# Patient Record
Sex: Female | Born: 1965
Health system: Southern US, Community
[De-identification: ages and names within clinical notes are randomized; demographics above are authoritative.]

## PROBLEM LIST (undated history)

## (undated) DIAGNOSIS — T7840XA Allergy, unspecified, initial encounter: Secondary | ICD-10-CM

## (undated) DIAGNOSIS — J309 Allergic rhinitis, unspecified: Secondary | ICD-10-CM

## (undated) DIAGNOSIS — R42 Dizziness and giddiness: Secondary | ICD-10-CM

## (undated) HISTORY — DX: Allergy, unspecified, initial encounter: T78.40XA

## (undated) HISTORY — DX: Dizziness and giddiness: R42

## (undated) HISTORY — DX: Allergic rhinitis, unspecified: J30.9

---
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
## 1997-03-23 ENCOUNTER — Inpatient Hospital Stay (HOSPITAL_COMMUNITY): Admission: AD | Admit: 1997-03-23 | Discharge: 1997-03-23 | Payer: Self-pay | Admitting: Obstetrics and Gynecology

## 1997-05-22 ENCOUNTER — Inpatient Hospital Stay (HOSPITAL_COMMUNITY): Admission: AD | Admit: 1997-05-22 | Discharge: 1997-05-24 | Payer: Self-pay | Admitting: *Deleted

## 1997-05-27 ENCOUNTER — Encounter (HOSPITAL_COMMUNITY): Admission: RE | Admit: 1997-05-27 | Discharge: 1997-08-25 | Payer: Self-pay | Admitting: Obstetrics and Gynecology

## 1998-04-18 ENCOUNTER — Inpatient Hospital Stay (HOSPITAL_COMMUNITY): Admission: AD | Admit: 1998-04-18 | Discharge: 1998-04-18 | Payer: Self-pay | Admitting: *Deleted

## 1998-09-08 ENCOUNTER — Ambulatory Visit (HOSPITAL_COMMUNITY): Admission: RE | Admit: 1998-09-08 | Discharge: 1998-09-08 | Payer: Self-pay | Admitting: *Deleted

## 1999-03-27 ENCOUNTER — Inpatient Hospital Stay (HOSPITAL_COMMUNITY): Admission: AD | Admit: 1999-03-27 | Discharge: 1999-03-27 | Payer: Self-pay | Admitting: Obstetrics and Gynecology

## 1999-06-12 ENCOUNTER — Inpatient Hospital Stay (HOSPITAL_COMMUNITY): Admission: AD | Admit: 1999-06-12 | Discharge: 1999-06-14 | Payer: Self-pay | Admitting: Obstetrics and Gynecology

## 2000-12-20 ENCOUNTER — Encounter: Payer: Self-pay | Admitting: Obstetrics and Gynecology

## 2000-12-20 ENCOUNTER — Ambulatory Visit (HOSPITAL_COMMUNITY): Admission: RE | Admit: 2000-12-20 | Discharge: 2000-12-20 | Payer: Self-pay | Admitting: Obstetrics and Gynecology

## 2001-08-14 ENCOUNTER — Other Ambulatory Visit: Admission: RE | Admit: 2001-08-14 | Discharge: 2001-08-14 | Payer: Self-pay | Admitting: *Deleted

## 2002-01-23 ENCOUNTER — Ambulatory Visit (HOSPITAL_COMMUNITY): Admission: RE | Admit: 2002-01-23 | Discharge: 2002-01-23 | Payer: Self-pay | Admitting: Family Medicine

## 2002-01-23 ENCOUNTER — Encounter: Payer: Self-pay | Admitting: Family Medicine

## 2002-09-20 ENCOUNTER — Other Ambulatory Visit: Admission: RE | Admit: 2002-09-20 | Discharge: 2002-09-20 | Payer: Self-pay | Admitting: *Deleted

## 2004-06-04 ENCOUNTER — Emergency Department (HOSPITAL_COMMUNITY): Admission: EM | Admit: 2004-06-04 | Discharge: 2004-06-04 | Payer: Self-pay | Admitting: Family Medicine

## 2005-02-07 ENCOUNTER — Ambulatory Visit (HOSPITAL_COMMUNITY): Admission: RE | Admit: 2005-02-07 | Discharge: 2005-02-07 | Payer: Self-pay | Admitting: *Deleted

## 2005-11-12 ENCOUNTER — Emergency Department (HOSPITAL_COMMUNITY): Admission: EM | Admit: 2005-11-12 | Discharge: 2005-11-12 | Payer: Self-pay | Admitting: Family Medicine

## 2006-02-28 ENCOUNTER — Ambulatory Visit (HOSPITAL_COMMUNITY): Admission: RE | Admit: 2006-02-28 | Discharge: 2006-02-28 | Payer: Self-pay | Admitting: *Deleted

## 2006-08-07 ENCOUNTER — Inpatient Hospital Stay (HOSPITAL_COMMUNITY): Admission: AD | Admit: 2006-08-07 | Discharge: 2006-08-10 | Payer: Self-pay | Admitting: Obstetrics & Gynecology

## 2006-08-15 ENCOUNTER — Ambulatory Visit: Admission: RE | Admit: 2006-08-15 | Discharge: 2006-08-15 | Payer: Self-pay | Admitting: *Deleted

## 2006-08-18 ENCOUNTER — Ambulatory Visit: Admission: RE | Admit: 2006-08-18 | Discharge: 2006-08-18 | Payer: Self-pay | Admitting: *Deleted

## 2007-08-04 ENCOUNTER — Emergency Department (HOSPITAL_COMMUNITY): Admission: EM | Admit: 2007-08-04 | Discharge: 2007-08-04 | Payer: Self-pay | Admitting: Family Medicine

## 2008-02-22 HISTORY — PX: REFRACTIVE SURGERY: SHX103

## 2009-06-17 ENCOUNTER — Ambulatory Visit (HOSPITAL_COMMUNITY): Admission: RE | Admit: 2009-06-17 | Discharge: 2009-06-17 | Payer: Self-pay | Admitting: Obstetrics and Gynecology

## 2010-03-14 ENCOUNTER — Encounter: Payer: Self-pay | Admitting: Obstetrics and Gynecology

## 2010-07-09 NOTE — Discharge Summary (Signed)
NAMETERREL, MANALO NO.:  [id number]   MEDICAL RECORD NO.:  [id number]          PATIENT TYPE:  INP   LOCATION:  9112                          FACILITY:  [facility]   PHYSICIAN:  Genia Del, M.D.DATE OF BIRTH:  1965/09/01   DATE OF ADMISSION:  08/07/2006  DATE OF DISCHARGE:  08/10/2006                               DISCHARGE SUMMARY   ADMISSION DIAGNOSIS:  40+ weeks' gestation, spontaneous labor,  spontaneous rupture of membranes.   DISCHARGE DIAGNOSIS:  40+ weeks' gestation, spontaneous labor,  spontaneous rupture of membranes.  Birth of a baby girl by spontaneous  vaginal delivery.   HOSPITAL COURSE:  Unremarkable.  The patient was discharged in stable  status on postpartum day #2. She was given postpartum advice and will be  followed in 6 weeks at [facility]. Her postpartum hemoglobin was  11.4 and hematocrit 33.8.      Genia Del, M.D.  Electronically Signed     ML/MEDQ  D:  09/17/2006  T:  09/18/2006  Job:  [id number]

## 2010-07-22 ENCOUNTER — Other Ambulatory Visit (HOSPITAL_COMMUNITY): Payer: Self-pay | Admitting: Obstetrics & Gynecology

## 2010-07-22 DIAGNOSIS — Z1231 Encounter for screening mammogram for malignant neoplasm of breast: Secondary | ICD-10-CM

## 2010-08-10 ENCOUNTER — Ambulatory Visit (HOSPITAL_COMMUNITY): Payer: Self-pay

## 2010-11-15 ENCOUNTER — Ambulatory Visit (HOSPITAL_COMMUNITY)
Admission: RE | Admit: 2010-11-15 | Discharge: 2010-11-15 | Disposition: A | Discharge status: Home / Self Care | Payer: 59 | Source: Ambulatory Visit | Attending: Obstetrics & Gynecology | Admitting: Obstetrics & Gynecology

## 2010-11-15 DIAGNOSIS — Z1231 Encounter for screening mammogram for malignant neoplasm of breast: Secondary | ICD-10-CM | POA: Insufficient documentation

## 2010-12-08 LAB — CBC
Hemoglobin: 11.4 — ABNORMAL LOW
Hemoglobin: 12
MCV: 92.4
Platelets: 189
RBC: 3.66 — ABNORMAL LOW
RBC: 3.84 — ABNORMAL LOW
WBC: 10.2
WBC: 8.6

## 2010-12-08 LAB — RPR: RPR Ser Ql: NONREACTIVE

## 2012-01-11 ENCOUNTER — Other Ambulatory Visit (HOSPITAL_BASED_OUTPATIENT_CLINIC_OR_DEPARTMENT_OTHER): Payer: Self-pay | Admitting: Obstetrics & Gynecology

## 2012-01-11 DIAGNOSIS — Z1231 Encounter for screening mammogram for malignant neoplasm of breast: Secondary | ICD-10-CM

## 2012-01-23 ENCOUNTER — Inpatient Hospital Stay (HOSPITAL_BASED_OUTPATIENT_CLINIC_OR_DEPARTMENT_OTHER): Admission: RE | Admit: 2012-01-23 | Payer: 59 | Source: Ambulatory Visit

## 2012-01-27 ENCOUNTER — Ambulatory Visit (HOSPITAL_BASED_OUTPATIENT_CLINIC_OR_DEPARTMENT_OTHER)
Admission: RE | Admit: 2012-01-27 | Discharge: 2012-01-27 | Disposition: A | Discharge status: Home / Self Care | Payer: 59 | Source: Ambulatory Visit | Attending: Obstetrics & Gynecology | Admitting: Obstetrics & Gynecology

## 2012-01-27 DIAGNOSIS — Z1231 Encounter for screening mammogram for malignant neoplasm of breast: Secondary | ICD-10-CM

## 2013-03-25 ENCOUNTER — Other Ambulatory Visit (HOSPITAL_BASED_OUTPATIENT_CLINIC_OR_DEPARTMENT_OTHER): Payer: Self-pay | Admitting: Obstetrics & Gynecology

## 2013-03-25 DIAGNOSIS — Z1231 Encounter for screening mammogram for malignant neoplasm of breast: Secondary | ICD-10-CM

## 2013-04-03 ENCOUNTER — Ambulatory Visit (HOSPITAL_BASED_OUTPATIENT_CLINIC_OR_DEPARTMENT_OTHER): Payer: 59

## 2013-04-24 ENCOUNTER — Ambulatory Visit (HOSPITAL_BASED_OUTPATIENT_CLINIC_OR_DEPARTMENT_OTHER)
Admission: RE | Admit: 2013-04-24 | Discharge: 2013-04-24 | Disposition: A | Discharge status: Home / Self Care | Payer: 59 | Source: Ambulatory Visit | Attending: Obstetrics & Gynecology | Admitting: Obstetrics & Gynecology

## 2013-04-24 DIAGNOSIS — Z1231 Encounter for screening mammogram for malignant neoplasm of breast: Secondary | ICD-10-CM | POA: Insufficient documentation

## 2013-04-26 ENCOUNTER — Other Ambulatory Visit: Payer: Self-pay | Admitting: Obstetrics & Gynecology

## 2013-04-26 DIAGNOSIS — R928 Other abnormal and inconclusive findings on diagnostic imaging of breast: Secondary | ICD-10-CM

## 2013-05-10 ENCOUNTER — Ambulatory Visit
Admission: RE | Admit: 2013-05-10 | Discharge: 2013-05-10 | Disposition: A | Discharge status: Home / Self Care | Payer: Self-pay | Source: Ambulatory Visit | Attending: Obstetrics & Gynecology | Admitting: Obstetrics & Gynecology

## 2013-05-10 ENCOUNTER — Ambulatory Visit
Admission: RE | Admit: 2013-05-10 | Discharge: 2013-05-10 | Disposition: A | Discharge status: Home / Self Care | Payer: 59 | Source: Ambulatory Visit | Attending: Obstetrics & Gynecology | Admitting: Obstetrics & Gynecology

## 2013-05-10 DIAGNOSIS — R928 Other abnormal and inconclusive findings on diagnostic imaging of breast: Secondary | ICD-10-CM

## 2015-02-05 ENCOUNTER — Other Ambulatory Visit (HOSPITAL_BASED_OUTPATIENT_CLINIC_OR_DEPARTMENT_OTHER): Payer: Self-pay | Admitting: Obstetrics & Gynecology

## 2015-02-05 DIAGNOSIS — Z1231 Encounter for screening mammogram for malignant neoplasm of breast: Secondary | ICD-10-CM

## 2015-02-19 ENCOUNTER — Ambulatory Visit (HOSPITAL_BASED_OUTPATIENT_CLINIC_OR_DEPARTMENT_OTHER)
Admission: RE | Admit: 2015-02-19 | Discharge: 2015-02-19 | Disposition: A | Discharge status: Home / Self Care | Payer: 59 | Source: Ambulatory Visit | Attending: Obstetrics & Gynecology | Admitting: Obstetrics & Gynecology

## 2015-02-19 DIAGNOSIS — Z1231 Encounter for screening mammogram for malignant neoplasm of breast: Secondary | ICD-10-CM | POA: Insufficient documentation

## 2015-04-02 MED FILL — ESTROPIPATE 1.25(1.5 MG) TA: 1.5 | 30 days supply | Qty: 60 | Fill #0

## 2015-04-02 MED FILL — PROGESTERONE 100 MG CAPSULE: 100 | 30 days supply | Qty: 30 | Fill #0

## 2015-04-14 DIAGNOSIS — I8312 Varicose veins of left lower extremity with inflammation: Secondary | ICD-10-CM | POA: Diagnosis not present

## 2015-04-14 DIAGNOSIS — I83813 Varicose veins of bilateral lower extremities with pain: Secondary | ICD-10-CM | POA: Diagnosis not present

## 2015-04-14 DIAGNOSIS — I8311 Varicose veins of right lower extremity with inflammation: Secondary | ICD-10-CM | POA: Diagnosis not present

## 2015-04-15 DIAGNOSIS — I8311 Varicose veins of right lower extremity with inflammation: Secondary | ICD-10-CM | POA: Diagnosis not present

## 2015-04-15 DIAGNOSIS — I8312 Varicose veins of left lower extremity with inflammation: Secondary | ICD-10-CM | POA: Diagnosis not present

## 2015-04-15 DIAGNOSIS — I83813 Varicose veins of bilateral lower extremities with pain: Secondary | ICD-10-CM | POA: Diagnosis not present

## 2015-04-17 DIAGNOSIS — I83813 Varicose veins of bilateral lower extremities with pain: Secondary | ICD-10-CM | POA: Diagnosis not present

## 2015-05-06 DIAGNOSIS — I83811 Varicose veins of right lower extremities with pain: Secondary | ICD-10-CM | POA: Diagnosis not present

## 2015-05-06 DIAGNOSIS — I8311 Varicose veins of right lower extremity with inflammation: Secondary | ICD-10-CM | POA: Diagnosis not present

## 2015-05-06 DIAGNOSIS — I87321 Chronic venous hypertension (idiopathic) with inflammation of right lower extremity: Secondary | ICD-10-CM | POA: Diagnosis not present

## 2015-05-11 MED FILL — ESTROPIPATE 1.25(1.5 MG) TA: 1.5 | 30 days supply | Qty: 60 | Fill #1

## 2015-05-11 MED FILL — PROGESTERONE 100 MG CAPSULE: 100 | 30 days supply | Qty: 30 | Fill #1

## 2015-05-20 DIAGNOSIS — I87321 Chronic venous hypertension (idiopathic) with inflammation of right lower extremity: Secondary | ICD-10-CM | POA: Diagnosis not present

## 2015-05-20 DIAGNOSIS — I8311 Varicose veins of right lower extremity with inflammation: Secondary | ICD-10-CM | POA: Diagnosis not present

## 2015-05-20 DIAGNOSIS — I83811 Varicose veins of right lower extremities with pain: Secondary | ICD-10-CM | POA: Diagnosis not present

## 2015-05-28 DIAGNOSIS — Z0100 Encounter for examination of eyes and vision without abnormal findings: Secondary | ICD-10-CM | POA: Diagnosis not present

## 2015-06-16 DIAGNOSIS — I83812 Varicose veins of left lower extremities with pain: Secondary | ICD-10-CM | POA: Diagnosis not present

## 2015-06-16 DIAGNOSIS — I87322 Chronic venous hypertension (idiopathic) with inflammation of left lower extremity: Secondary | ICD-10-CM | POA: Diagnosis not present

## 2015-06-16 DIAGNOSIS — I8312 Varicose veins of left lower extremity with inflammation: Secondary | ICD-10-CM | POA: Diagnosis not present

## 2015-06-16 MED FILL — ESTROPIPATE 1.25(1.5 MG) TA: 1.5 | 30 days supply | Qty: 60 | Fill #2

## 2015-06-16 MED FILL — PROGESTERONE 100 MG CAPSULE: 100 | 30 days supply | Qty: 30 | Fill #2

## 2015-06-29 DIAGNOSIS — I83811 Varicose veins of right lower extremities with pain: Secondary | ICD-10-CM | POA: Diagnosis not present

## 2015-07-13 DIAGNOSIS — I87322 Chronic venous hypertension (idiopathic) with inflammation of left lower extremity: Secondary | ICD-10-CM | POA: Diagnosis not present

## 2015-07-13 DIAGNOSIS — I8312 Varicose veins of left lower extremity with inflammation: Secondary | ICD-10-CM | POA: Diagnosis not present

## 2015-07-13 DIAGNOSIS — M7981 Nontraumatic hematoma of soft tissue: Secondary | ICD-10-CM | POA: Diagnosis not present

## 2015-07-13 DIAGNOSIS — I83812 Varicose veins of left lower extremities with pain: Secondary | ICD-10-CM | POA: Diagnosis not present

## 2015-07-22 MED FILL — PROGESTERONE 100 MG CAPSULE: 100 | 30 days supply | Qty: 30 | Fill #3

## 2015-07-22 MED FILL — ESTROPIPATE 1.25(1.5 MG) TA: 1.5 | 30 days supply | Qty: 60 | Fill #3

## 2015-08-03 DIAGNOSIS — I8312 Varicose veins of left lower extremity with inflammation: Secondary | ICD-10-CM | POA: Diagnosis not present

## 2015-08-17 DIAGNOSIS — I8311 Varicose veins of right lower extremity with inflammation: Secondary | ICD-10-CM | POA: Diagnosis not present

## 2015-08-26 MED FILL — ESTROPIPATE 1.25(1.5 MG) TA: 1.5 | 30 days supply | Qty: 60 | Fill #4

## 2015-08-26 MED FILL — PROGESTERONE 100 MG CAPSULE: 100 | 30 days supply | Qty: 30 | Fill #4

## 2015-09-07 DIAGNOSIS — I8312 Varicose veins of left lower extremity with inflammation: Secondary | ICD-10-CM | POA: Diagnosis not present

## 2015-09-08 MED FILL — KETOCONAZOLE 2% CREAM: 2 | 30 days supply | Qty: 30 | Fill #0

## 2015-09-24 DIAGNOSIS — I8311 Varicose veins of right lower extremity with inflammation: Secondary | ICD-10-CM | POA: Diagnosis not present

## 2015-09-29 MED FILL — ESTROPIPATE 1.25(1.5 MG) TA: 1.5 | 30 days supply | Qty: 60 | Fill #5

## 2015-09-29 MED FILL — PROGESTERONE 100 MG CAPSULE: 100 | 30 days supply | Qty: 30 | Fill #5

## 2015-10-05 DIAGNOSIS — I8311 Varicose veins of right lower extremity with inflammation: Secondary | ICD-10-CM | POA: Diagnosis not present

## 2015-10-16 DIAGNOSIS — I8311 Varicose veins of right lower extremity with inflammation: Secondary | ICD-10-CM | POA: Diagnosis not present

## 2015-11-02 MED FILL — ESTROPIPATE 1.25(1.5 MG) TA: 1.5 | 30 days supply | Qty: 60 | Fill #6

## 2015-11-02 MED FILL — PROGESTERONE 100 MG CAPSULE: 100 | 30 days supply | Qty: 30 | Fill #6

## 2015-12-07 MED FILL — ESTROPIPATE 1.25(1.5 MG) TA: 1.5 | 30 days supply | Qty: 60 | Fill #7

## 2015-12-07 MED FILL — PROGESTERONE 100 MG CAPSULE: 100 | 30 days supply | Qty: 30 | Fill #7

## 2016-01-08 MED FILL — PROGESTERONE 100 MG CAPSULE: 100 | 30 days supply | Qty: 30 | Fill #8

## 2016-01-08 MED FILL — ESTROPIPATE 1.25(1.5 MG) TA: 1.5 | 30 days supply | Qty: 60 | Fill #8

## 2016-02-03 ENCOUNTER — Other Ambulatory Visit (HOSPITAL_BASED_OUTPATIENT_CLINIC_OR_DEPARTMENT_OTHER): Payer: Self-pay | Admitting: Obstetrics & Gynecology

## 2016-02-03 DIAGNOSIS — Z1231 Encounter for screening mammogram for malignant neoplasm of breast: Secondary | ICD-10-CM

## 2016-02-09 MED FILL — PROGESTERONE 100 MG CAPSULE: 100 | 30 days supply | Qty: 30 | Fill #9

## 2016-02-09 MED FILL — ESTROPIPATE 1.25(1.5 MG) TA: 1.5 | 30 days supply | Qty: 60 | Fill #9

## 2016-02-20 ENCOUNTER — Ambulatory Visit (HOSPITAL_BASED_OUTPATIENT_CLINIC_OR_DEPARTMENT_OTHER)
Admission: RE | Admit: 2016-02-20 | Discharge: 2016-02-20 | Disposition: A | Discharge status: Home / Self Care | Payer: 59 | Source: Ambulatory Visit | Attending: Obstetrics & Gynecology | Admitting: Obstetrics & Gynecology

## 2016-02-20 DIAGNOSIS — Z1231 Encounter for screening mammogram for malignant neoplasm of breast: Secondary | ICD-10-CM | POA: Diagnosis not present

## 2016-11-01 MED FILL — miSOPROStol 200 MCG TABS: 200 | 2 days supply | Qty: 4 | Fill #0

## 2017-01-19 ENCOUNTER — Other Ambulatory Visit (HOSPITAL_BASED_OUTPATIENT_CLINIC_OR_DEPARTMENT_OTHER): Payer: Self-pay | Admitting: Obstetrics & Gynecology

## 2017-01-19 DIAGNOSIS — Z1231 Encounter for screening mammogram for malignant neoplasm of breast: Secondary | ICD-10-CM

## 2017-03-01 ENCOUNTER — Ambulatory Visit (HOSPITAL_BASED_OUTPATIENT_CLINIC_OR_DEPARTMENT_OTHER)
Admission: RE | Admit: 2017-03-01 | Discharge: 2017-03-01 | Disposition: A | Discharge status: Home / Self Care | Payer: BLUE CROSS/BLUE SHIELD | Source: Ambulatory Visit | Attending: Obstetrics & Gynecology | Admitting: Obstetrics & Gynecology

## 2017-03-01 ENCOUNTER — Encounter (HOSPITAL_BASED_OUTPATIENT_CLINIC_OR_DEPARTMENT_OTHER): Payer: Self-pay

## 2017-03-01 DIAGNOSIS — Z1231 Encounter for screening mammogram for malignant neoplasm of breast: Secondary | ICD-10-CM | POA: Insufficient documentation

## 2017-07-28 ENCOUNTER — Ambulatory Visit: Payer: Self-pay | Admitting: Family Medicine

## 2017-07-28 VITALS — BP 95/75 | HR 64 | Temp 98.2°F | Resp 16 | Wt 123.6 lb

## 2017-07-28 DIAGNOSIS — L209 Atopic dermatitis, unspecified: Secondary | ICD-10-CM

## 2017-07-28 MED ORDER — FLUOCINOLONE ACETONIDE BODY 0.01 % EX OIL: 1.0000 "application " | TOPICAL_OIL | Freq: Three times a day (TID) | CUTANEOUS | 0 refills | Status: DC

## 2017-07-28 MED FILL — FLUOCINOLONE ACETONIDE BODY: 0.01 | 30 days supply | Qty: 118 | Fill #0

## 2017-07-28 NOTE — Progress Notes (Signed)
Colleen Archer is a 52 y.o. female who presents today with concerns of ear lobe peeling that has persisted for 2 months. She has trialed OTC hydrocortisone, triple antibiotic ointment and clotrimazole cream without relief.  Review of Systems  Constitutional: Negative for chills, fever and malaise/fatigue.  HENT: Negative for congestion, ear discharge, ear pain, sinus pain and sore throat.   Eyes: Negative.   Respiratory: Negative for cough, sputum production and shortness of breath.   Cardiovascular: Negative.  Negative for chest pain.  Gastrointestinal: Negative for abdominal pain, diarrhea, nausea and vomiting.  Genitourinary: Negative for dysuria, frequency, hematuria and urgency.  Musculoskeletal: Negative for myalgias.  Skin: Positive for itching and rash.  Neurological: Negative for headaches.  Endo/Heme/Allergies: Negative.   Psychiatric/Behavioral: Negative.     O: Vitals:   07/28/17 1717  BP: 95/75  Pulse: 64  Resp: 16  Temp: 98.2 F (36.8 C)  SpO2: 96%     Physical Exam  Constitutional: She is oriented to person, place, and time. Vital signs are normal. She appears well-developed and well-nourished. She is active.  Non-toxic appearance. She does not have a sickly appearance.  HENT:  Head: Normocephalic.  Right Ear: Hearing, tympanic membrane, external ear and ear canal normal.  Left Ear: Hearing, tympanic membrane, external ear and ear canal normal.  Nose: Nose normal.  Mouth/Throat: Uvula is midline and oropharynx is clear and moist.  Neck: Normal range of motion. Neck supple.  Cardiovascular: Normal rate, regular rhythm, normal heart sounds and normal pulses.  Pulmonary/Chest: Effort normal and breath sounds normal.  Abdominal: Soft. Bowel sounds are normal.  Musculoskeletal: Normal range of motion.  Lymphadenopathy:       Head (right side): No submental and no submandibular adenopathy present.       Head (left side): No submental and no submandibular  adenopathy present.    She has no cervical adenopathy.  Neurological: She is alert and oriented to person, place, and time.  Skin:     Erythremic peeling at earlobe worse on right side-   Psychiatric: She has a normal mood and affect.  Vitals reviewed.   A: 1. Atopic dermatitis, unspecified type     P: Suspect seborrheic dermatitis- will have patient f/u in 7 days for visual exam.  Exam findings, diagnosis etiology and medication use and indications reviewed with patient. Follow- Up and discharge instructions provided. No emergent/urgent issues found on exam.  Patient verbalized understanding of information provided and agrees with plan of care (POC), all questions answered.  1. Atopic dermatitis, unspecified type - Fluocinolone Acetonide Body 0.01 % OIL; Apply 1 application topically 3 (three) times daily.

## 2017-07-28 NOTE — Patient Instructions (Addendum)
Fluocinolone topical oil What is this medicine? FLUOCINOLONE (floo oh SIN oh lone) is a corticosteroid. It is used to treat skin problems that may cause itching and swelling. This medicine may be used for other purposes; ask your health care provider or pharmacist if you have questions. COMMON BRAND NAME(S): Derma-Smoothe/FS What should I tell my health care provider before I take this medicine? They need to know if you have any of these conditions: -any type of active infection -diabetes -large areas of burned or damaged skin -skin wasting or thinning -an unusual or allergic reaction to fluocinolone, steroids, other medicines, peanuts, foods, dyes, or preservatives -pregnant or trying to get pregnant -breast-feeding How should I use this medicine? This medicine is for external use only. Do not take by mouth. Follow the directions on the prescription label. Wash your hands before and after use. Do not use this medicine more often than directed or for a longer period of time than ordered by your doctor or health care professional. To do so may increase the chance of side effects. Do not use on healthy skin or over large areas of skin. Do not get this medicine in your eyes. If you do, rinse it out with plenty of cool tap water. For areas other than the scalp- Apply a thin film of oil to the affected area and rub in gently. Do not bandage or wrap the skin being treated unless directed to do so by your doctor or health care professional. For use on the scalp- Wet or dampen your hair and scalp before using this medicine. Apply a thin film of oil, massage well, and cover your scalp with the shower cap provided. Leave on overnight or for at least 4 hours before washing off unless you are given different instructions from your doctor or health care professional. Then, wash your hair with regular shampoo and rinse completely. Talk to your pediatrician regarding the use of this medicine in children. While  this drug may be prescribed for children as young as 59 months of age for selected conditions, precautions do apply. Overdosage: If you think you have taken too much of this medicine contact a poison control center or emergency room at once. NOTE: This medicine is only for you. Do not share this medicine with others. What if I miss a dose? If you miss a dose, use it as soon as you can. If it is almost time for your next dose, use only that dose. Do not use double or extra doses. What may interact with this medicine? Interactions are not expected. Do not use any other skin products without telling your doctor or health care professional. This list may not describe all possible interactions. Give your health care provider a list of all the medicines, herbs, non-prescription drugs, or dietary supplements you use. Also tell them if you smoke, drink alcohol, or use illegal drugs. Some items may interact with your medicine. What should I watch for while using this medicine? Tell your doctor or health care professional if your symptoms do not get better within one week. Tell your doctor or health care professional if you are exposed to anyone with measles or chickenpox, or if you develop sores or blisters that do not heal properly. What side effects may I notice from receiving this medicine? Side effects that you should report to your doctor or health care professional as soon as possible: -dark red spots on the skin -lack of healing of the skin condition -painful, red, pus  filled blisters in hair follicles -severe burning and continued itching of the skin -thinning of the skin with easy bruising Side effects that usually do not require medical attention (report to your doctor or health care professional if they continue or are bothersome): -burning, itching, or irritation of the skin -dry skin -increased redness or scaling of the skin -unusual increased growth of hair on the face or body This list may  not describe all possible side effects. Call your doctor for medical advice about side effects. You may report side effects to FDA at 1-800-FDA-1088. Where should I keep my medicine? Keep out of the reach of children. Store at room temperature between 15 and 30 degrees C (59 and 86 degrees F) away from heat and direct light. Do not freeze. Throw away any unused medicine after the expiration date. NOTE: This sheet is a summary. It may not cover all possible information. If you have questions about this medicine, talk to your doctor, pharmacist, or health care provider.  2018 Elsevier/Gold Standard (2007-06-13 16:59:04)  Atopic Dermatitis Atopic dermatitis is a skin disorder that causes inflammation of the skin. This is the most common type of eczema. Eczema is a group of skin conditions that cause the skin to be itchy, red, and swollen. This condition is generally worse during the cooler winter months and often improves during the warm summer months. Symptoms can vary from person to person. Atopic dermatitis usually starts showing signs in infancy and can last through adulthood. This condition cannot be passed from one person to another (non-contagious), but it is more common in families. Atopic dermatitis may not always be present. When it is present, it is called a flare-up. What are the causes? The exact cause of this condition is not known. Flare-ups of the condition may be triggered by:  Contact with something that you are sensitive or allergic to.  Stress.  Certain foods.  Extremely hot or cold weather.  Harsh chemicals and soaps.  Dry air.  Chlorine.  What increases the risk? This condition is more likely to develop in people who have a personal history or family history of eczema, allergies, asthma, or hay fever. What are the signs or symptoms? Symptoms of this condition include:  Dry, scaly skin.  Red, itchy rash.  Itchiness, which can be severe. This may occur before the  skin rash. This can make sleeping difficult.  Skin thickening and cracking that can occur over time.  How is this diagnosed? This condition is diagnosed based on your symptoms, a medical history, and a physical exam. How is this treated? There is no cure for this condition, but symptoms can usually be controlled. Treatment focuses on:  Controlling the itchiness and scratching. You may be given medicines, such as antihistamines or steroid creams.  Limiting exposure to things that you are sensitive or allergic to (allergens).  Recognizing situations that cause stress and developing a plan to manage stress.  If your atopic dermatitis does not get better with medicines, or if it is all over your body (widespread), a treatment using a specific type of light (phototherapy) may be used. Follow these instructions at home: Skin care  Keep your skin well-moisturized. Doing this seals in moisture and helps to prevent dryness. ? Use unscented lotions that have petroleum in them. ? Avoid lotions that contain alcohol or water. They can dry the skin.  Keep baths or showers short (less than 5 minutes) in warm water. Do not use hot water. ? Use mild,  unscented cleansers for bathing. Avoid soap and bubble bath. ? Apply a moisturizer to your skin right after a bath or shower.  Do not apply anything to your skin without checking with your health care provider. General instructions  Dress in clothes made of cotton or cotton blends. Dress lightly because heat increases itchiness.  When washing your clothes, rinse your clothes twice so all of the soap is removed.  Avoid any triggers that can cause a flare-up.  Try to manage your stress.  Keep your fingernails cut short.  Avoid scratching. Scratching makes the rash and itchiness worse. It may also result in a skin infection (impetigo) due to a break in the skin caused by scratching.  Take or apply over-the-counter and prescription medicines only as  told by your health care provider.  Keep all follow-up visits as told by your health care provider. This is important.  Do not be around people who have cold sores or fever blisters. If you get the infection, it may cause your atopic dermatitis to worsen. Contact a health care provider if:  Your itchiness interferes with sleep.  Your rash gets worse or it is not better within one week of starting treatment.  You have a fever.  You have a rash flare-up after having contact with someone who has cold sores or fever blisters. Get help right away if:  You develop pus or soft yellow scabs in the rash area. Summary  This condition causes a red rash and itchy, dry, scaly skin.  Treatment focuses on controlling the itchiness and scratching, limiting exposure to things that you are sensitive or allergic to (allergens), recognizing situations that cause stress, and developing a plan to manage stress.  Keep your skin well-moisturized.  Keep baths or showers shorter than 5 minutes and use warm water. Do not use hot water. This information is not intended to replace advice given to you by your health care provider. Make sure you discuss any questions you have with your health care provider. Document Released: 02/05/2000 Document Revised: 03/11/2016 Document Reviewed: 03/11/2016 Elsevier Interactive Patient Education  [employer]2018 Elsevier Inc.

## 2017-12-04 IMAGING — MG 2D DIGITAL SCREENING BILATERAL MAMMOGRAM WITH CAD AND ADJUNCT TO
8 series · 9 of 24 positions shown · non-contrast
Comparison: Previous exam(s).

CLINICAL DATA: Screening.

EXAM:
2D DIGITAL SCREENING BILATERAL MAMMOGRAM WITH CAD AND ADJUNCT TOMO

[L MLO]
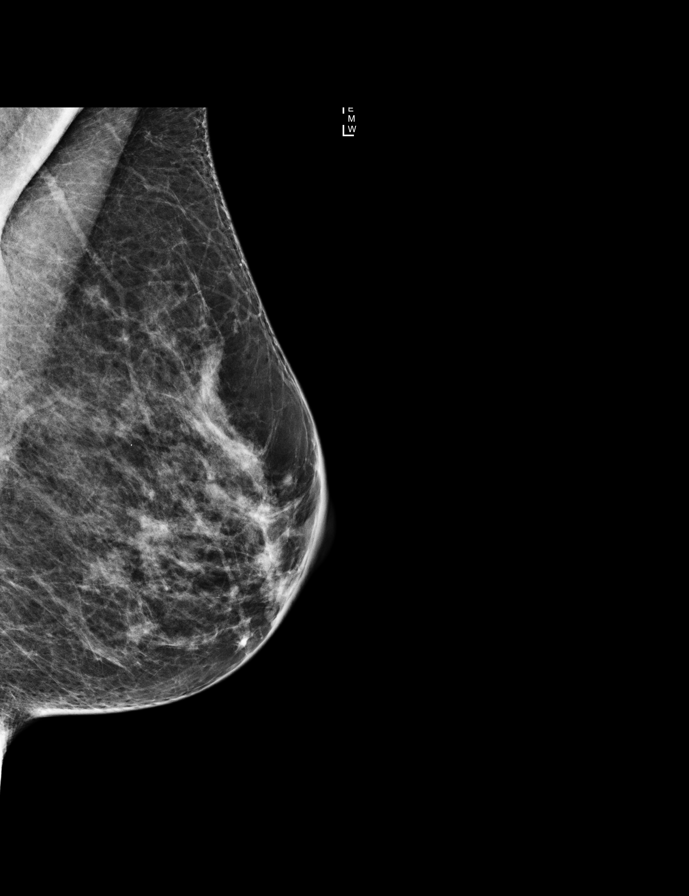

[R CC]
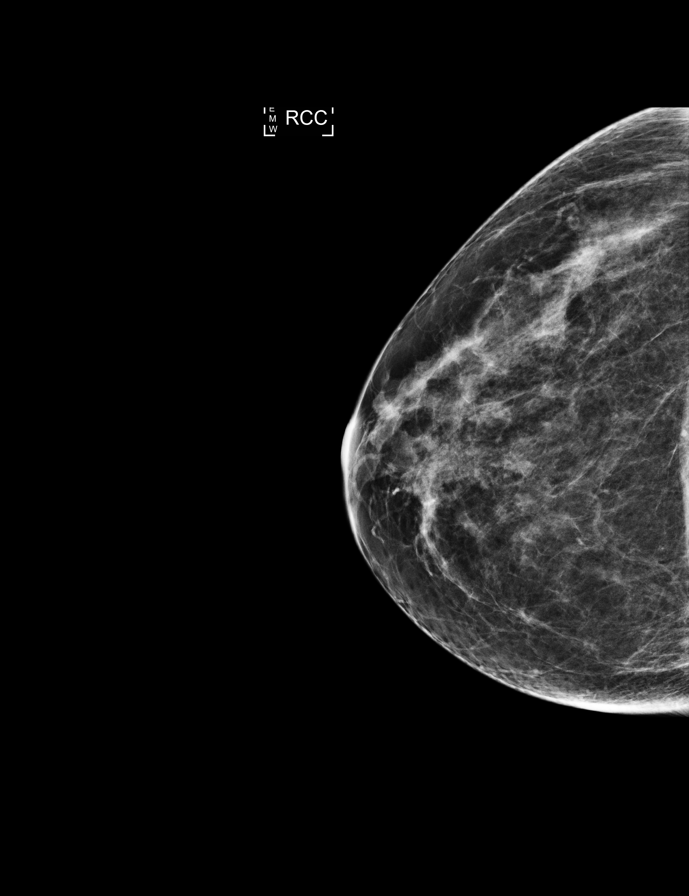

[L CC]
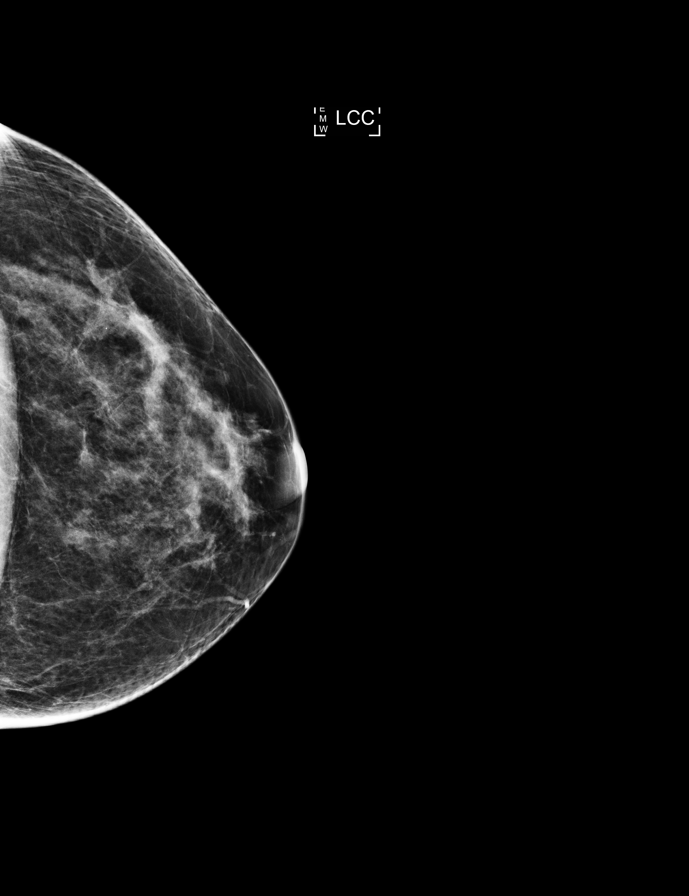

[R MLO]
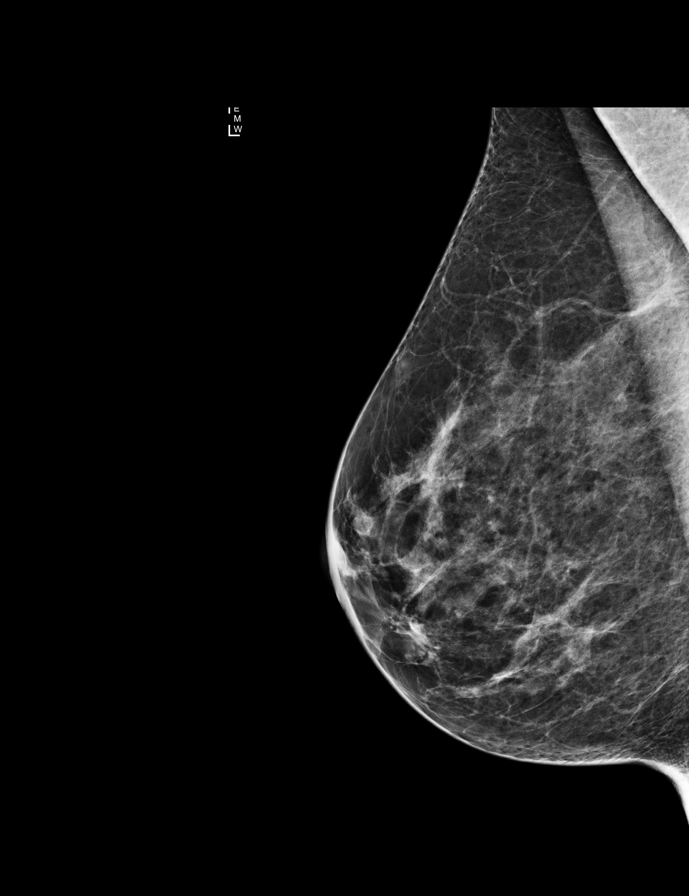

[L MLO tomo · 2 of 51 frames shown]
[frame 17/51]
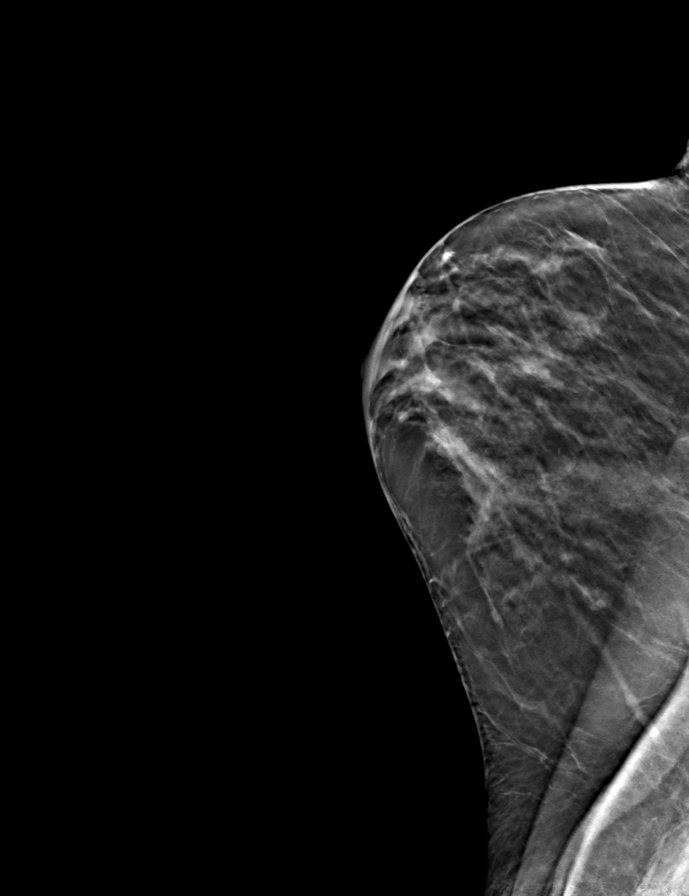
[frame 26/51]
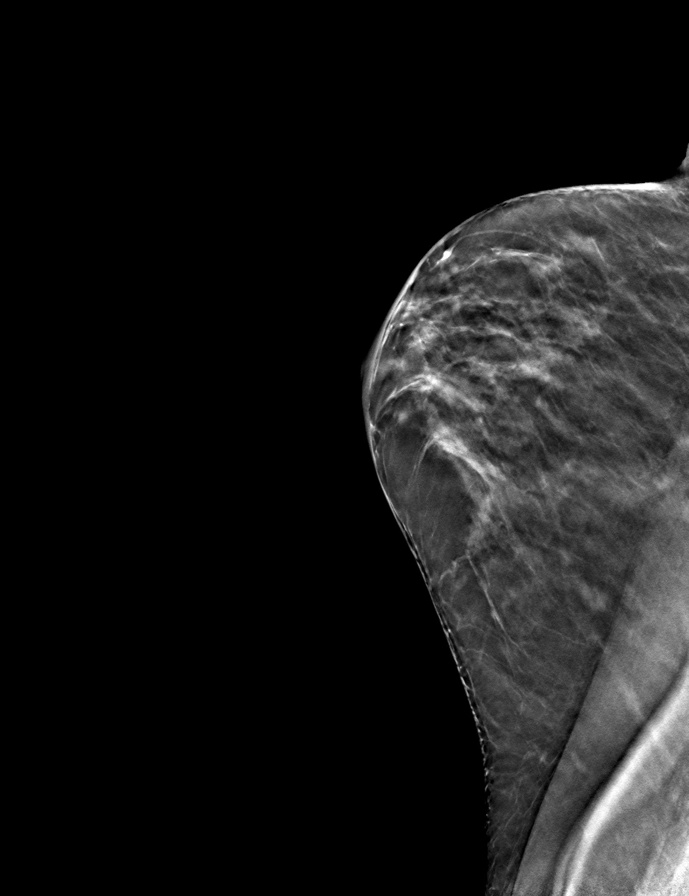

[R CC tomo · tomo slice 29/56.0]
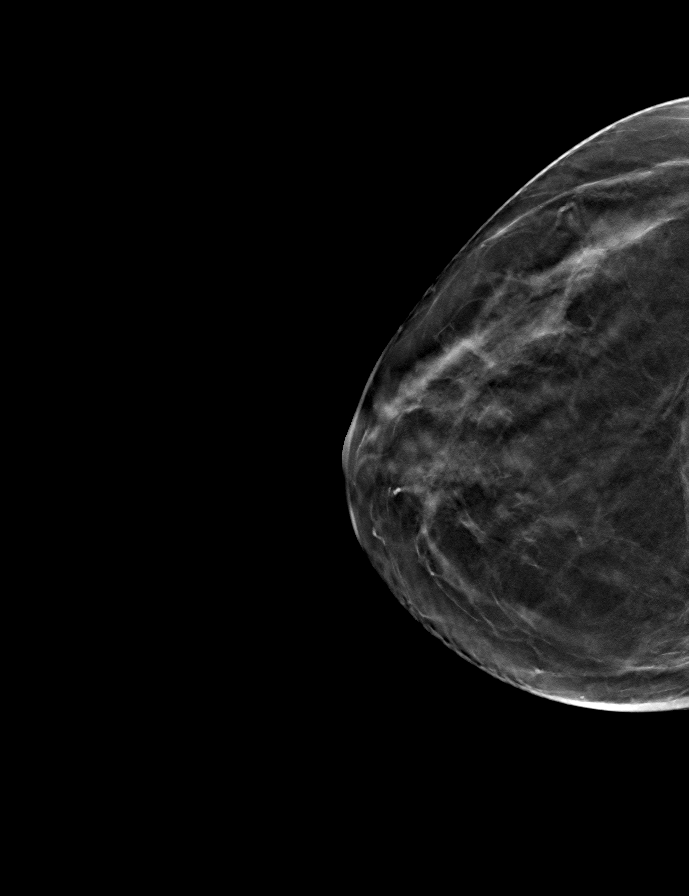

[R MLO tomo · tomo slice 27/52.0]
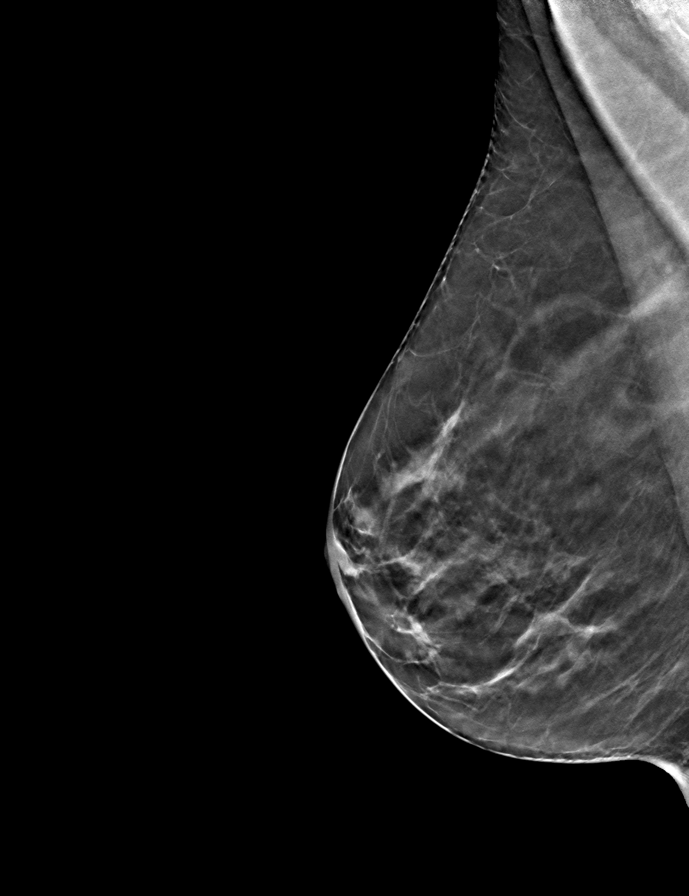

[L CC tomo · tomo slice 29/58.0]
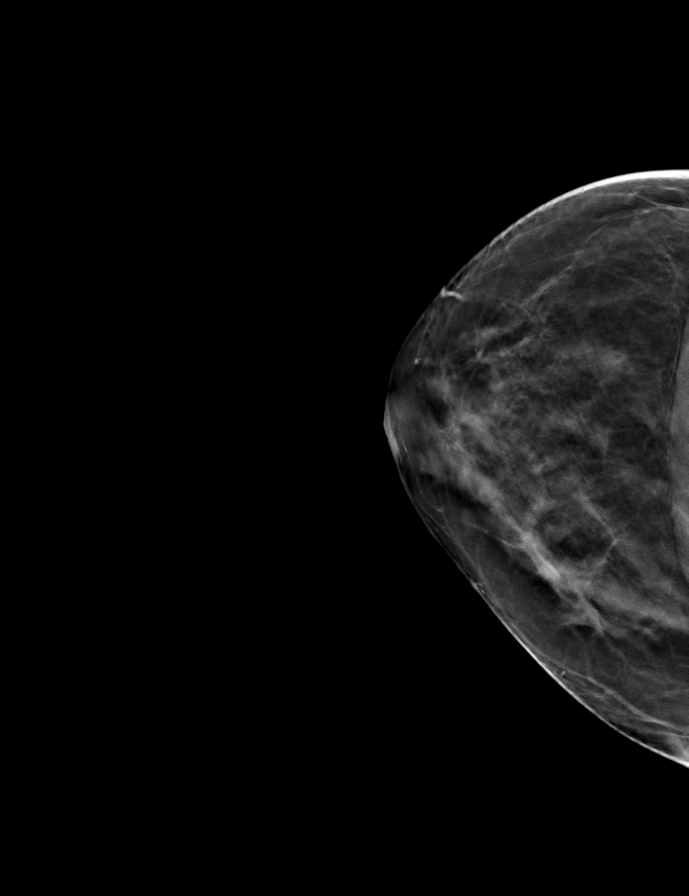

[9 of 24 positions shown; findings below may reference images not displayed]

ACR Breast Density Category b: There are scattered areas of
fibroglandular density.
FINDINGS: There are no findings suspicious for malignancy. Images were
processed with CAD.
IMPRESSION: No mammographic evidence of malignancy. A result letter of this
screening mammogram will be mailed directly to the patient.

RECOMMENDATION:
Screening mammogram in one year. (Code:97-6-RS4)

BI-RADS CATEGORY  1: Negative.

## 2018-03-09 ENCOUNTER — Other Ambulatory Visit (HOSPITAL_BASED_OUTPATIENT_CLINIC_OR_DEPARTMENT_OTHER): Payer: Self-pay | Admitting: Obstetrics & Gynecology

## 2018-03-09 DIAGNOSIS — Z1231 Encounter for screening mammogram for malignant neoplasm of breast: Secondary | ICD-10-CM

## 2018-03-12 ENCOUNTER — Ambulatory Visit (HOSPITAL_BASED_OUTPATIENT_CLINIC_OR_DEPARTMENT_OTHER)
Admission: RE | Admit: 2018-03-12 | Discharge: 2018-03-12 | Disposition: A | Discharge status: Home / Self Care | Payer: BLUE CROSS/BLUE SHIELD | Source: Ambulatory Visit | Attending: Obstetrics & Gynecology | Admitting: Obstetrics & Gynecology

## 2018-03-12 DIAGNOSIS — Z1231 Encounter for screening mammogram for malignant neoplasm of breast: Secondary | ICD-10-CM | POA: Insufficient documentation

## 2018-04-19 DIAGNOSIS — Z01419 Encounter for gynecological examination (general) (routine) without abnormal findings: Secondary | ICD-10-CM | POA: Insufficient documentation

## 2018-04-19 LAB — RESULTS CONSOLE HPV: CHL HPV: NEGATIVE

## 2018-04-19 LAB — HM PAP SMEAR

## 2018-04-30 ENCOUNTER — Encounter: Payer: Self-pay | Admitting: Internal Medicine

## 2018-05-14 ENCOUNTER — Telehealth: Payer: Self-pay | Admitting: *Deleted

## 2018-05-14 NOTE — Telephone Encounter (Signed)
Not able to reach on second attempt 

## 2018-05-14 NOTE — Telephone Encounter (Signed)
LMOM

## 2018-05-15 ENCOUNTER — Telehealth: Payer: Self-pay | Admitting: *Deleted

## 2018-05-15 NOTE — Telephone Encounter (Signed)
Patient no showed for Previsit appointment today. Called the patient on preferred # and message left to call back by 5 pm today to reschedule. If no reposinse by 5 pm both Previsit and Colonoscopy will be cancelled.

## 2018-05-29 ENCOUNTER — Encounter: Payer: BLUE CROSS/BLUE SHIELD | Admitting: Internal Medicine

## 2019-02-22 HISTORY — PX: COLONOSCOPY: SHX174

## 2019-04-26 ENCOUNTER — Other Ambulatory Visit (HOSPITAL_BASED_OUTPATIENT_CLINIC_OR_DEPARTMENT_OTHER): Payer: Self-pay | Admitting: Family Medicine

## 2019-04-26 DIAGNOSIS — Z1231 Encounter for screening mammogram for malignant neoplasm of breast: Secondary | ICD-10-CM

## 2019-05-22 ENCOUNTER — Ambulatory Visit (HOSPITAL_BASED_OUTPATIENT_CLINIC_OR_DEPARTMENT_OTHER)
Admission: RE | Admit: 2019-05-22 | Discharge: 2019-05-22 | Disposition: A | Discharge status: Home / Self Care | Payer: BC Managed Care – PPO | Source: Ambulatory Visit | Attending: Family Medicine | Admitting: Family Medicine

## 2019-05-22 ENCOUNTER — Other Ambulatory Visit: Payer: Self-pay

## 2019-05-22 DIAGNOSIS — Z1231 Encounter for screening mammogram for malignant neoplasm of breast: Secondary | ICD-10-CM | POA: Diagnosis not present

## 2019-08-12 ENCOUNTER — Ambulatory Visit (AMBULATORY_SURGERY_CENTER): Payer: Self-pay

## 2019-08-12 ENCOUNTER — Other Ambulatory Visit: Payer: Self-pay

## 2019-08-12 VITALS — Ht 62.0 in | Wt 115.0 lb

## 2019-08-12 DIAGNOSIS — Z1211 Encounter for screening for malignant neoplasm of colon: Secondary | ICD-10-CM

## 2019-08-12 NOTE — Progress Notes (Signed)
No egg or soy allergy known to patient  No issues with past sedation with any surgeries  or procedures, no intubation problems  No diet pills per patient No home 02 use per patient  No blood thinners per patient  Pt denies issues with constipation  No A fib or A flutter  EMMI video sent to pt's e mail   Pt has been vaccinated for covid.  Due to the COVID-19 pandemic we are asking patients to follow these guidelines. Please only bring one care partner. Please be aware that your care partner may wait in the car in the parking lot or if they feel like they will be too hot to wait in the car, they may wait in the lobby on the 4th floor. All care partners are required to wear a mask the entire time (we do not have any that we can provide them), they need to practice social distancing, and we will do a Covid check for all patient's and care partners when you arrive. Also we will check their temperature and your temperature. If the care partner waits in their car they need to stay in the parking lot the entire time and we will call them on their cell phone when the patient is ready for discharge so they can bring the car to the front of the building. Also all patient's will need to wear a mask into building.  

## 2019-08-13 ENCOUNTER — Encounter: Payer: Self-pay | Admitting: Gastroenterology

## 2019-08-21 ENCOUNTER — Other Ambulatory Visit: Payer: Self-pay

## 2019-08-21 ENCOUNTER — Ambulatory Visit (AMBULATORY_SURGERY_CENTER): Payer: BC Managed Care – PPO | Admitting: Gastroenterology

## 2019-08-21 ENCOUNTER — Encounter: Payer: Self-pay | Admitting: Gastroenterology

## 2019-08-21 VITALS — BP 119/80 | HR 66 | Temp 98.4°F | Resp 17 | Ht 62.0 in | Wt 115.0 lb

## 2019-08-21 DIAGNOSIS — Z1211 Encounter for screening for malignant neoplasm of colon: Secondary | ICD-10-CM | POA: Diagnosis not present

## 2019-08-21 HISTORY — PX: COLONOSCOPY: SHX174

## 2019-08-21 MED ORDER — SODIUM CHLORIDE 0.9 % IV SOLN: 500.0000 mL | Freq: Once | INTRAVENOUS | Status: DC

## 2019-08-21 NOTE — Progress Notes (Signed)
No problems noted in the recovery room. Pt's husband had a colonoscopy before her.  He had no sedation, so he will be the driver.  He was at the bedside when Dr. Christella Hartigan came in and gave the pt her results and also when I went over discharge instructions. maw

## 2019-08-21 NOTE — Progress Notes (Signed)
VS by CW  Pt's states no medical or surgical changes since previsit or office visit.  

## 2019-08-21 NOTE — Progress Notes (Signed)
PT taken to PACU. Monitors in place. VSS. Report given to RN. 

## 2019-08-21 NOTE — Op Note (Signed)
[facility] Patient Name: Colleen Archer Procedure Date: 08/21/2019 2:41 PM MRN: [id number] Endoscopist: Rachael Fee , MD Age: 54 Referring MD:  Date of Birth: 1965-03-14 Gender: Female Account #: [id number] Procedure:                Colonoscopy Indications:              Screening for colorectal malignant neoplasm Medicines:                Monitored Anesthesia Care Procedure:                Pre-Anesthesia Assessment:                           - Prior to the procedure, a History and Physical                            was performed, and patient medications and                            allergies were reviewed. The patient's tolerance of                            previous anesthesia was also reviewed. The risks                            and benefits of the procedure and the sedation                            options and risks were discussed with the patient.                            All questions were answered, and informed consent                            was obtained. Prior Anticoagulants: The patient has                            taken no previous anticoagulant or antiplatelet                            agents. ASA Grade Assessment: I - A normal, healthy                            patient. After reviewing the risks and benefits,                            the patient was deemed in satisfactory condition to                            undergo the procedure.                           After obtaining informed consent, the colonoscope  was passed under direct vision. Throughout the                            procedure, the patient's blood pressure, pulse, and                            oxygen saturations were monitored continuously. The                            Colonoscope was introduced through the anus and                            advanced to the the cecum, identified by                            appendiceal orifice and ileocecal  valve. The                            colonoscopy was performed without difficulty. The                            patient tolerated the procedure well. The quality                            of the bowel preparation was excellent. Scope In: 2:51:28 PM Scope Out: 3:05:42 PM Scope Withdrawal Time: 0 hours 7 minutes 6 seconds  Total Procedure Duration: 0 hours 14 minutes 14 seconds  Findings:                 The entire examined colon appeared normal on direct                            and retroflexion views. Complications:            No immediate complications. Estimated blood loss:                            None. Estimated Blood Loss:     Estimated blood loss: none. Impression:               - The entire examined colon is normal on direct and                            retroflexion views.                           - No polyps or cancers. Recommendation:           - Patient has a contact number available for                            emergencies. The signs and symptoms of potential                            delayed complications were discussed with the  patient. Return to normal activities tomorrow.                            Written discharge instructions were provided to the                            patient.                           - Resume previous diet.                           - Continue present medications.                           - Repeat colonoscopy in 10 years for screening. Rachael Fee, MD 08/21/2019 3:07:41 PM This report has been signed electronically.

## 2019-08-21 NOTE — Patient Instructions (Addendum)
?You may resume your current medications today. ?Repeat colonoscopy in 10 years for screening purposes. ?Please call if any questions or concerns. ?  ? ? ? ?YOU HAD AN ENDOSCOPIC PROCEDURE TODAY AT THE [facility]:   Refer to the procedure report that was given to you for any specific questions about what was found during the examination.  If the procedure report does not answer your questions, please call your gastroenterologist to clarify.  If you requested that your care partner not be given the details of your procedure findings, then the procedure report has been included in a sealed envelope for you to review at your convenience later. ? ?YOU SHOULD EXPECT: Some feelings of bloating in the abdomen. Passage of more gas than usual.  Walking can help get rid of the air that was put into your GI tract during the procedure and reduce the bloating. If you had a lower endoscopy (such as a colonoscopy or flexible sigmoidoscopy) you may notice spotting of blood in your stool or on the toilet paper. If you underwent a bowel prep for your procedure, you may not have a normal bowel movement for a few days. ? ?Please Note:  You might notice some irritation and congestion in your nose or some drainage.  This is from the oxygen used during your procedure.  There is no need for concern and it should clear up in a day or so. ? ?SYMPTOMS TO REPORT IMMEDIATELY: ? ?Following lower endoscopy (colonoscopy or flexible sigmoidoscopy): ? Excessive amounts of blood in the stool ? Significant tenderness or worsening of abdominal pains ? Swelling of the abdomen that is new, acute ? Fever of 100?F or higher ? ? ?For urgent or emergent issues, a gastroenterologist can be reached at any hour by calling [phone]. ?Do not use MyChart messaging for urgent concerns.  ? ? ?DIET:  We do recommend a small meal at first, but then you may proceed to your regular diet.  Drink plenty of fluids but you should avoid alcoholic  beverages for 24 hours. ? ?ACTIVITY:  You should plan to take it easy for the rest of today and you should NOT DRIVE or use heavy machinery until tomorrow (because of the sedation medicines used during the test).   ? ?FOLLOW UP: ?Our staff will call the number listed on your records 48-72 hours following your procedure to check on you and address any questions or concerns that you may have regarding the information given to you following your procedure. If we do not reach you, we will leave a message.  We will attempt to reach you two times.  During this call, we will ask if you have developed any symptoms of COVID 19. If you develop any symptoms (ie: fever, flu-like symptoms, shortness of breath, cough etc.) before then, please call [phone].  If you test positive for Covid 19 in the 2 weeks post procedure, please call and report this information to us.   ? ?If any biopsies were taken you will be contacted by phone or by letter within the next 1-3 weeks.  Please call us at [phone] if you have not heard about the biopsies in 3 weeks.  ? ? ?SIGNATURES/CONFIDENTIALITY: ?You and/or your care partner have signed paperwork which will be entered into your electronic medical record.  These signatures attest to the fact that that the information above on your After Visit Summary has been reviewed and is understood.  Full responsibility of the confidentiality of this   discharge information lies with you and/or your care-partner.  ?

## 2019-08-23 ENCOUNTER — Telehealth: Payer: Self-pay

## 2019-08-23 NOTE — Telephone Encounter (Signed)
1st follow up call made.  NALM 

## 2019-08-23 NOTE — Telephone Encounter (Signed)
  Follow up Call-  Call back number 08/21/2019  Post procedure Call Back phone  # [phone]  Permission to leave phone message Yes  Some recent data might be hidden     Patient questions:  Do you have a fever, pain , or abdominal swelling? No. Pain Score  0 *  Have you tolerated food without any problems? Yes.    Have you been able to return to your normal activities? Yes.    Do you have any questions about your discharge instructions: Diet   No. Medications  No. Follow up visit  No.  Do you have questions or concerns about your Care? No.  Actions: * If pain score is 4 or above: No action needed, pain <4.   1. Have you developed a fever since your procedure? no  2.   Have you had an respiratory symptoms (SOB or cough) since your procedure? no  3.   Have you tested positive for COVID 19 since your procedure no  4.   Have you had any family members/close contacts diagnosed with the COVID 19 since your procedure?  no   If yes to any of these questions please route to Laverna Peace, RN and Charlett Lango, RN

## 2019-10-11 MED FILL — MELOXICAM 7.5 MG TABLET: 7.5 | 30 days supply | Qty: 30 | Fill #0

## 2019-12-23 DIAGNOSIS — S92902A Unspecified fracture of left foot, initial encounter for closed fracture: Secondary | ICD-10-CM

## 2019-12-23 HISTORY — DX: Unspecified fracture of left foot, initial encounter for closed fracture: S92.902A

## 2020-01-05 ENCOUNTER — Ambulatory Visit (HOSPITAL_COMMUNITY)
Admission: EM | Admit: 2020-01-05 | Discharge: 2020-01-05 | Disposition: A | Discharge status: Home / Self Care | Payer: BC Managed Care – PPO | Attending: Emergency Medicine | Admitting: Emergency Medicine

## 2020-01-05 ENCOUNTER — Other Ambulatory Visit: Payer: Self-pay

## 2020-01-05 ENCOUNTER — Encounter (HOSPITAL_COMMUNITY): Payer: Self-pay | Admitting: *Deleted

## 2020-01-05 ENCOUNTER — Ambulatory Visit (INDEPENDENT_AMBULATORY_CARE_PROVIDER_SITE_OTHER): Payer: BC Managed Care – PPO

## 2020-01-05 DIAGNOSIS — W19XXXA Unspecified fall, initial encounter: Secondary | ICD-10-CM | POA: Diagnosis not present

## 2020-01-05 DIAGNOSIS — M79672 Pain in left foot: Secondary | ICD-10-CM | POA: Diagnosis not present

## 2020-01-05 DIAGNOSIS — S92352A Displaced fracture of fifth metatarsal bone, left foot, initial encounter for closed fracture: Secondary | ICD-10-CM | POA: Diagnosis not present

## 2020-01-05 MED ORDER — IBUPROFEN 800 MG PO TABS: 800.0000 mg | ORAL_TABLET | Freq: Three times a day (TID) | ORAL | 0 refills | Status: DC

## 2020-01-05 MED ORDER — HYDROCODONE-ACETAMINOPHEN 5-325 MG PO TABS
1.0000 | ORAL_TABLET | Freq: Four times a day (QID) | ORAL | 0 refills | Status: DC | PRN
Start: 2020-01-05 — End: 2021-05-27

## 2020-01-05 NOTE — Discharge Instructions (Signed)
Follow up with Dr. Jena Gauss or orther orthopedic this week Use anti-inflammatories for pain/swelling. You may take up to 800 mg Ibuprofen every 8 hours with food. You may supplement Ibuprofen with Tylenol [phone] mg every 8 hours.  Hydrocodone for severe pain Nonweightbearing Ice and elevate

## 2020-01-05 NOTE — ED Triage Notes (Signed)
Pt reports cleaning ceiling in shower while on a stepstool in bathtub @ approx 1300 today when she stepped off incorrectly, jamming left foot between stool and side of bathtub.  C/O left dorsal foot pain and inability to bear weight.  Left dorsal lateral foot swelling noted; LLE DP pulse 2+ with active movement of all toes, and prompt cap refill in all toes.  C/O some slight numbness to left 4th and 5th toes.  Has taken Aleve.

## 2020-01-05 NOTE — ED Provider Notes (Signed)
[facility]    CSN: [id number] Arrival date & time: 01/05/20  1447      History   Chief Complaint Chief Complaint  Patient presents with  . Foot Injury    HPI Colleen Archer is a 54 y.o. female presenting today for evaluation of foot injury.  Reports that she was on a stool while cleaning, slipped and fell and her left foot jammed between the stool and the side of the bathtub.  Since has had pain to the outer aspect of her left foot.  Denies any prior fracture.  Denies any ankle or knee pain.  Reports some slight numbness and decreased sensation around her fifth toe, but otherwise normal sensation.  Took Aleve prior to arrival.  HPI  Past Medical History:  Diagnosis Date  . Allergy    seasonal  . Normal vaginal delivery    3 total   1999, 2001, 2008    There are no problems to display for this patient.   Past Surgical History:  Procedure Laterality Date  . REFRACTIVE SURGERY Bilateral 2010  . TONSILLECTOMY  1972  . WISDOM TOOTH EXTRACTION  1984    OB History   No obstetric history on file.      Home Medications    Prior to Admission medications   Medication Sig Start Date End Date Taking? Authorizing Provider  naproxen sodium (ALEVE) 220 MG tablet Take 220 mg by mouth.   Yes [provider]  HYDROcodone-acetaminophen (NORCO/VICODIN) 5-325 MG tablet Take 1-2 tablets by mouth every 6 (six) hours as needed for severe pain. 01/05/20   Raveena Hebdon C, PA-C  ibuprofen (ADVIL) 800 MG tablet Take 1 tablet (800 mg total) by mouth 3 (three) times daily. 01/05/20   Annasofia Pohl, Junius Creamer, PA-C    Family History Family History  Problem Relation Age of Onset  . Healthy Father   . Colon cancer Neg Hx   . Colon polyps Neg Hx   . Esophageal cancer Neg Hx   . Rectal cancer Neg Hx   . Stomach cancer Neg Hx     Social History Social History   Tobacco Use  . Smoking status: Never Smoker  . Smokeless tobacco: Never Used  Vaping Use  . Vaping Use:  Never used  Substance Use Topics  . Alcohol use: Yes    Comment: "moderate use"  . Drug use: Never     Allergies   Patient has no known allergies.   Review of Systems Review of Systems  Constitutional: Negative for fatigue and fever.  Eyes: Negative for visual disturbance.  Respiratory: Negative for shortness of breath.   Cardiovascular: Negative for chest pain.  Gastrointestinal: Negative for abdominal pain, nausea and vomiting.  Musculoskeletal: Positive for arthralgias, gait problem and joint swelling.  Skin: Negative for color change, rash and wound.  Neurological: Negative for dizziness, weakness, light-headedness and headaches.     Physical Exam Triage Vital Signs ED Triage Vitals  Enc Vitals Group     BP      Pulse      Resp      Temp      Temp src      SpO2      Weight      Height      Head Circumference      Peak Flow      Pain Score      Pain Loc      Pain Edu?      Excl.  in GC?    No data found.  Updated Vital Signs BP 135/78   Pulse 70   Temp 98.2 F (36.8 C) (Oral)   Resp 16   LMP 10/29/2010   SpO2 98%   Visual Acuity Right Eye Distance:   Left Eye Distance:   Bilateral Distance:    Right Eye Near:   Left Eye Near:    Bilateral Near:     Physical Exam Vitals and nursing note reviewed.  Constitutional:      Appearance: She is well-developed.     Comments: No acute distress  HENT:     Head: Normocephalic and atraumatic.     Nose: Nose normal.  Eyes:     Conjunctiva/sclera: Conjunctivae normal.  Cardiovascular:     Rate and Rhythm: Normal rate.  Pulmonary:     Effort: Pulmonary effort is normal. No respiratory distress.  Abdominal:     General: There is no distension.  Musculoskeletal:        General: Normal range of motion.     Cervical back: Neck supple.     Comments: Left foot: Area of swelling and faint bruising noted to lateral distal aspect of foot over distal fourth and fifth metatarsals Sensation intact  distally Dorsalis pedis 2+   Left ankle without any swelling or tenderness to palpation of bilateral malleoli, Achilles firm  Skin:    General: Skin is warm and dry.  Neurological:     Mental Status: She is alert and oriented to person, place, and time.      UC Treatments / Results  Labs (all labs ordered are listed, but only abnormal results are displayed) Labs Reviewed - No data to display  EKG   Radiology No results found.  Procedures Procedures (including critical care time)  Medications Ordered in UC Medications - No data to display  Initial Impression / Assessment and Plan / UC Course  I have reviewed the triage vital signs and the nursing notes.  Pertinent labs & imaging results that were available during my care of the patient were reviewed by me and considered in my medical decision making (see chart for details).     5th metatarsal shaft fracture, moderately displaced and angulated, nonweightbearing with crutches, patient already has crutches and a boot at home, will have her use this, Tylenol and ibuprofen for mild to moderate pain, hydrocodone for severe pain.  Patient to follow-up with orthopedics/Dr. Haddix this week.  Provided contact information.  Ice and elevate.  Neurovascularly intact.  Discussed strict return precautions. Patient verbalized understanding and is agreeable with plan.  Final Clinical Impressions(s) / UC Diagnoses   Final diagnoses:  Closed displaced fracture of fifth metatarsal bone of left foot, initial encounter     Discharge Instructions     Follow up with Dr. Jena Gauss or orther orthopedic this week Use anti-inflammatories for pain/swelling. You may take up to 800 mg Ibuprofen every 8 hours with food. You may supplement Ibuprofen with Tylenol [phone] mg every 8 hours.  Hydrocodone for severe pain Nonweightbearing Ice and elevate     ED Prescriptions    Medication Sig Dispense Auth. Provider   ibuprofen (ADVIL) 800 MG  tablet Take 1 tablet (800 mg total) by mouth 3 (three) times daily. 21 tablet Krysteena Stalker C, PA-C   HYDROcodone-acetaminophen (NORCO/VICODIN) 5-325 MG tablet Take 1-2 tablets by mouth every 6 (six) hours as needed for severe pain. 12 tablet Saraiya Kozma, Hubbell C, PA-C     I have reviewed the PDMP  during this encounter.   Lew Dawes, [place] 01/05/20 1658

## 2020-08-06 ENCOUNTER — Other Ambulatory Visit (HOSPITAL_BASED_OUTPATIENT_CLINIC_OR_DEPARTMENT_OTHER): Payer: Self-pay | Admitting: Obstetrics & Gynecology

## 2020-08-06 DIAGNOSIS — Z1231 Encounter for screening mammogram for malignant neoplasm of breast: Secondary | ICD-10-CM

## 2020-08-11 ENCOUNTER — Other Ambulatory Visit: Payer: Self-pay

## 2020-08-11 ENCOUNTER — Ambulatory Visit (HOSPITAL_BASED_OUTPATIENT_CLINIC_OR_DEPARTMENT_OTHER)
Admission: RE | Admit: 2020-08-11 | Discharge: 2020-08-11 | Disposition: A | Discharge status: Home / Self Care | Payer: BC Managed Care – PPO | Source: Ambulatory Visit | Attending: Obstetrics & Gynecology | Admitting: Obstetrics & Gynecology

## 2020-08-11 DIAGNOSIS — Z1231 Encounter for screening mammogram for malignant neoplasm of breast: Secondary | ICD-10-CM | POA: Insufficient documentation

## 2020-09-11 ENCOUNTER — Other Ambulatory Visit (HOSPITAL_BASED_OUTPATIENT_CLINIC_OR_DEPARTMENT_OTHER): Payer: Self-pay

## 2020-09-11 MED ORDER — TRIAMCINOLONE ACETONIDE 0.1 % EX OINT
TOPICAL_OINTMENT | CUTANEOUS | 0 refills | Status: DC
Start: 2020-09-11 — End: 2021-05-27
  Filled 2020-09-11: qty 60, 30d supply, fill #0

## 2020-09-11 MED ORDER — FLUCONAZOLE 150 MG PO TABS
ORAL_TABLET | ORAL | 1 refills | Status: DC
Start: 2020-09-11 — End: 2021-05-27
  Filled 2020-09-11: qty 2, 7d supply, fill #0

## 2020-09-11 MED ORDER — ESTRADIOL 0.1 MG/GM VA CREA
TOPICAL_CREAM | VAGINAL | 5 refills | Status: DC
Start: 2020-09-11 — End: 2021-12-31
  Filled 2020-09-11: qty 42.5, 90d supply, fill #0
  Filled 2020-09-11: qty 42.5, 45d supply, fill #0

## 2021-05-24 ENCOUNTER — Telehealth: Payer: Self-pay

## 2021-05-24 NOTE — Telephone Encounter (Signed)
Pt is not established yet and NPP not received.  ? ?[facility] - Client ?TELEPHONE ADVICE RECORD ?[facility]? ?Patient Name: ?Colleen Archer ?Gender: Female ?DOB: 1965-11-08 ?Age: 56 Y 58 M 13D ?Return Phone Number: [phone] ?(Primary) ?Address: ?City/State/Zip: [place] [address] ?Client [facility] [facility] - Client ?Client Site [facility] ?Provider Crissie Sickles - MD ?Contact Type Call ?Who Is Calling Patient / Member / Family / Caregiver ?Call Type Triage / Clinical ?Relationship To Patient Self ?Return Phone Number [phone] (Primary) ?Chief Complaint Unclassified Symptom ?Reason for Call Symptomatic / Request for Health Information ?Initial Comment Caller states she is set up for her first visit on ?04/10. She thinks she has pink eye and she thinks she may need a sick visit before then. ?Translation No ?Nurse Assessment ?Nurse: Jerene Bears, RN, Will Date/Time Eilene Ghazi Time): 05/23/2021 12:48:08 PM ?Confirm and document reason for call. If ?symptomatic, describe symptoms. ?---Caller reports she has left eye swelling on her lower ?eyelid with visible puss on lower eyelid. Effected eye ?is itchy. Onset of symptoms today. ?Does the patient have any new or worsening ?symptoms? ---Yes ?Will a triage be completed? ---Yes ?Related visit to physician within the last 2 weeks? ---No ?Does the PT have any chronic conditions? (i.e. ?diabetes, asthma, this includes High risk factors for ?pregnancy, etc.) ?---Yes ?List chronic conditions. ---seasonal allergies ?Is this a behavioral health or substance abuse call? ---No ?Guidelines ?Guideline Title Affirmed Question Affirmed Notes Nurse Date/Time (Eastern ?Time) ?Eye - Pus or ?Discharge ?Eyelid is red and ?painful (or tender to ?touch) ?Jerene Bears, RN, Will 05/23/2021 12:51:00 ?PM ?Disp. Time (Eastern ?Time) Disposition Final User ?05/23/2021 12:55:52 PM See PCP within 24 Hours Yes Jerene Bears, RN, Will ?PLEASE NOTE:  All timestamps contained within this report are represented as [place] Standard Time. ?CONFIDENTIALTY NOTICE: This fax transmission is intended only for the addressee. It contains information that is legally privileged, confidential or ?otherwise protected from use or disclosure. If you are not the intended recipient, you are strictly prohibited from reviewing, disclosing, copying using ?or disseminating any of this information or taking any action in reliance on or regarding this information. If you have received this fax in error, please ?notify us immediately by telephone so that we can arrange for its return to [place]. Phone: [phone], Toll-Free: [phone], Fax: [phone] ?Page: 2 of 2 ?Call Id: [id number] ?Caller Disagree/Comply Comply ?Caller Understands Yes ?PreDisposition Did not know what to do ?Care Advice Given Per Guideline ?SEE PCP WITHIN 24 HOURS: * IF OFFICE WILL BE OPEN: You need to be examined within the next 24 hours. Call your doctor ?(or NP/PA) when the office opens and make an appointment. EYELID CLEANSING: * Gently wash eyelids and lashes with warm ?water and wet cotton balls (or cotton gauze). Remove all the dried and liquid pus. * Do this whenever pus is seen on the eyelids. ?Continue to do this until your appointment. CALL BACK IF: * Blurred vision occurs * Light bothers your eyes * Fever occurs * You ?become worse CARE ADVICE given per Eye - Pus or Discharge (Adult) guideline. ?Referrals ?GO TO FACILITY UNDECIDED ?

## 2021-05-27 ENCOUNTER — Other Ambulatory Visit: Payer: Self-pay

## 2021-05-31 ENCOUNTER — Encounter: Payer: Self-pay | Admitting: Family Medicine

## 2021-05-31 ENCOUNTER — Ambulatory Visit (INDEPENDENT_AMBULATORY_CARE_PROVIDER_SITE_OTHER): Payer: BC Managed Care – PPO | Admitting: Family Medicine

## 2021-05-31 ENCOUNTER — Other Ambulatory Visit (HOSPITAL_COMMUNITY): Payer: Self-pay

## 2021-05-31 VITALS — BP 107/70 | HR 70 | Temp 97.7°F | Ht 62.75 in | Wt 117.4 lb

## 2021-05-31 DIAGNOSIS — J301 Allergic rhinitis due to pollen: Secondary | ICD-10-CM

## 2021-05-31 DIAGNOSIS — H81399 Other peripheral vertigo, unspecified ear: Secondary | ICD-10-CM | POA: Diagnosis not present

## 2021-05-31 MED ORDER — PROMETHAZINE HCL 12.5 MG PO TABS
12.5000 mg | ORAL_TABLET | Freq: Four times a day (QID) | ORAL | 0 refills | Status: DC | PRN
  Filled 2021-05-31: qty 10, 3d supply, fill #0

## 2021-05-31 MED ORDER — PROMETHAZINE HCL 12.5 MG RE SUPP
RECTAL | 0 refills | Status: DC
  Filled 2021-05-31: qty 10, 2d supply, fill #0

## 2021-05-31 NOTE — Progress Notes (Signed)
Office Note ?05/31/2021 ? ?CC:  ?Chief Complaint  ?Patient presents with  ? Establish Care  ? ?HPI:  ?Colleen Archer is a 56 y.o. White female who is here to establish care and discuss hx of vertigo. ?Patient's most recent primary MD: none ?Old records were reviewed prior to or during today's visit. ? ?Pre-employment cpe was done at [facility] Nov 2022. ?She states all labs at that visit were normal. ? ?She has had a couple of episodes of vertigo a year for the last few years.  Describes a spinning sensation that she sometimes will just wake up with.  Then rolling over or abrupt motions with her head trigger it for couple days. ?Gets very nauseous with it.  Lasts 2 to 3 days typically. ?No hearing changes. ? ? ? ?Past Medical History:  ?Diagnosis Date  ? Allergic rhinitis   ? Vertigo   ? 2-3 episodes per year  ? ? ?Past Surgical History:  ?Procedure Laterality Date  ? COLONOSCOPY  08/21/2019  ? NORMAL 2021. Recall 10 yrs  ? REFRACTIVE SURGERY Bilateral 2010  ? TONSILLECTOMY  1972  ? WISDOM TOOTH EXTRACTION  1984  ? ? ?Family History  ?Problem Relation Age of Onset  ? Healthy Father   ? Breast cancer Maternal Grandmother   ? Colon cancer Neg Hx   ? Colon polyps Neg Hx   ? Esophageal cancer Neg Hx   ? Rectal cancer Neg Hx   ? Stomach cancer Neg Hx   ? ? ?Social History  ? ?Socioeconomic History  ? Marital status: Married  ?  Spouse name: Not on file  ? Number of children: Not on file  ? Years of education: Not on file  ? Highest education level: Not on file  ?Occupational History  ? Not on file  ?Tobacco Use  ? Smoking status: Never  ? Smokeless tobacco: Never  ?Vaping Use  ? Vaping Use: Never used  ?Substance and Sexual Activity  ? Alcohol use: Yes  ?  Comment: "moderate use"  ? Drug use: Never  ? Sexual activity: Yes  ?  Partners: Male  ?  Comment: married  ?Other Topics Concern  ? Not on file  ?Social History Narrative  ? Not on file  ? ?Social Determinants of Health  ? ?Financial Resource Strain: Not on file  ?Food  Insecurity: Not on file  ?Transportation Needs: Not on file  ?Physical Activity: Not on file  ?Stress: Not on file  ?Social Connections: Not on file  ?Intimate Partner Violence: Not on file  ? ? ?Outpatient Encounter Medications as of 05/31/2021  ?Medication Sig  ? estradiol (ESTRACE) 0.1 MG/GM vaginal cream Insert 1 gram of cream in vagina every night for 14 nights then every Monday and Thursday  ? Fluticasone Propionate (FLONASE NA) Place into the nose as needed.  ? Loratadine (CLARITIN PO) Take by mouth as needed for allergies.  ? Multiple Vitamin (MULTIVITAMIN ADULT PO) Take 1 capsule by mouth daily.  ? ?No facility-administered encounter medications on file as of 05/31/2021.  ? ? ?No Known Allergies ? ?Review of Systems  ?Constitutional:  Negative for fever.  ?Cardiovascular:  Negative for leg swelling.  ?Gastrointestinal:  Negative for abdominal pain.  ?Genitourinary:  Negative for dysuria, flank pain and urgency.  ?Musculoskeletal:  Negative for neck pain.  ?Skin:  Negative for rash.  ?Neurological:  Negative for dizziness, tremors and weakness.  ? ?PE; ?Blood pressure 107/70, pulse 70, temperature 97.7 ?F (36.5 ?C), temperature source Oral,  height 5' 2.75" (1.594 m), weight 117 lb 6.4 oz (53.3 kg), last menstrual period 10/29/2010, SpO2 98 %.Body mass index is 20.96 kg/m?. ? ?Physical Exam ? ?Gen: Alert, well appearing.  Patient is oriented to person, place, time, and situation. ?AFFECT: pleasant, lucid thought and speech. ?UDJ:SHFW: no injection, icteris, swelling, or exudate.  EOMI, PERRLA. ?Mouth: lips without lesion/swelling.  Oral mucosa pink and moist. Oropharynx without erythema, exudate, or swelling.  ?Neck - No masses or thyromegaly or limitation in range of motion. No bruits. ?CV: RRR, no m/r/g.   ?LUNGS: CTA bilat, nonlabored resps, good aeration in all lung fields. ?EXT: no clubbing or cyanosis.  no edema.  ?Neuro: CN 2-12 intact bilaterally, strength 5/5 in proximal and distal upper extremities  and lower extremities bilaterally.    No tremor.  No ataxia.   ? ?Pertinent labs:  ?none  ? ?ASSESSMENT AND PLAN:  ? ?New patient, establishing care. ? ?Positional vertigo. ?Discussed diagnosis, discussed and gave handout for home Epley maneuvers. ?Phenergan 12.5 mg suppositories and tabs prescribed, #10 of each, no refill. ? ?Allergic rhinitis. ?Continue over-the-counter Flonase and Claritin. ? ?An After Visit Summary was printed and given to the patient. ? ?No follow-ups on file. ? ?Signed:  Santiago Bumpers, MD           05/31/2021 ? ?

## 2021-06-01 ENCOUNTER — Other Ambulatory Visit (HOSPITAL_COMMUNITY): Payer: Self-pay

## 2021-12-24 ENCOUNTER — Other Ambulatory Visit (HOSPITAL_BASED_OUTPATIENT_CLINIC_OR_DEPARTMENT_OTHER): Payer: Self-pay | Admitting: Family Medicine

## 2021-12-24 DIAGNOSIS — Z1231 Encounter for screening mammogram for malignant neoplasm of breast: Secondary | ICD-10-CM

## 2021-12-27 ENCOUNTER — Encounter (HOSPITAL_BASED_OUTPATIENT_CLINIC_OR_DEPARTMENT_OTHER): Payer: Self-pay

## 2021-12-27 ENCOUNTER — Ambulatory Visit (HOSPITAL_BASED_OUTPATIENT_CLINIC_OR_DEPARTMENT_OTHER)
Admission: RE | Admit: 2021-12-27 | Discharge: 2021-12-27 | Disposition: A | Discharge status: Home / Self Care | Payer: BC Managed Care – PPO | Source: Ambulatory Visit | Attending: Family Medicine | Admitting: Family Medicine

## 2021-12-27 DIAGNOSIS — Z1231 Encounter for screening mammogram for malignant neoplasm of breast: Secondary | ICD-10-CM | POA: Insufficient documentation

## 2021-12-28 NOTE — Patient Instructions (Signed)

## 2021-12-31 ENCOUNTER — Encounter: Payer: Self-pay | Admitting: Family Medicine

## 2021-12-31 ENCOUNTER — Ambulatory Visit (INDEPENDENT_AMBULATORY_CARE_PROVIDER_SITE_OTHER): Payer: BC Managed Care – PPO | Admitting: Family Medicine

## 2021-12-31 VITALS — BP 96/66 | HR 84 | Temp 98.5°F | Ht 62.75 in | Wt 118.0 lb

## 2021-12-31 DIAGNOSIS — Z23 Encounter for immunization: Secondary | ICD-10-CM | POA: Diagnosis not present

## 2021-12-31 DIAGNOSIS — Z0000 Encounter for general adult medical examination without abnormal findings: Secondary | ICD-10-CM

## 2021-12-31 LAB — COMPREHENSIVE METABOLIC PANEL
ALT: 25 U/L (ref 0–35)
AST: 25 U/L (ref 0–37)
Albumin: 4.1 g/dL (ref 3.5–5.2)
Alkaline Phosphatase: 81 U/L (ref 39–117)
BUN: 12 mg/dL (ref 6–23)
CO2: 31 mEq/L (ref 19–32)
Calcium: 9 mg/dL (ref 8.4–10.5)
Chloride: 107 mEq/L (ref 96–112)
Creatinine, Ser: 0.78 mg/dL (ref 0.40–1.20)
GFR: 83.69 mL/min (ref >60.00)
Glucose, Bld: 88 mg/dL (ref 70–99)
Potassium: 4.6 mEq/L (ref 3.5–5.1)
Sodium: 141 mEq/L (ref 135–145)
Total Bilirubin: 0.4 mg/dL (ref 0.2–1.2)
Total Protein: 6.4 g/dL (ref 6.0–8.3)

## 2021-12-31 LAB — LIPID PANEL
Cholesterol: 156 mg/dL (ref 0–200)
HDL: 47.4 mg/dL (ref >39.00)
LDL Cholesterol: 96 mg/dL (ref 0–99)
NonHDL: 108.71
Total CHOL/HDL Ratio: 3
Triglycerides: 62 mg/dL (ref 0.0–149.0)
VLDL: 12.4 mg/dL (ref 0.0–40.0)

## 2021-12-31 LAB — CBC
HCT: 39.3 % (ref 36.0–46.0)
Hemoglobin: 13.3 g/dL (ref 12.0–15.0)
MCHC: 33.8 g/dL (ref 30.0–36.0)
MCV: 94.1 fl (ref 78.0–100.0)
Platelets: 192 10*3/uL (ref 150.0–400.0)
RBC: 4.18 Mil/uL (ref 3.87–5.11)
RDW: 12.7 % (ref 11.5–15.5)
WBC: 4.2 10*3/uL (ref 4.0–10.5)

## 2021-12-31 LAB — TSH: TSH: 1.1 u[IU]/mL (ref 0.35–5.50)

## 2021-12-31 NOTE — Progress Notes (Signed)
Office Note 12/31/2021  CC:  Chief Complaint  Patient presents with   Annual Exam    HPI:  Patient is a 56 y.o. female who is here for annual health maintenance exam.  Past Medical History:  Diagnosis Date   Allergic rhinitis    Foot fracture, left 12/2019   Vertigo    2-3 episodes per year    Past Surgical History:  Procedure Laterality Date   COLONOSCOPY  08/21/2019   NORMAL 2021. Recall 10 yrs   REFRACTIVE SURGERY Bilateral 2010   TONSILLECTOMY  1972   WISDOM TOOTH EXTRACTION  1984    Family History  Problem Relation Age of Onset   Healthy Father    Breast cancer Maternal Grandmother    Colon cancer Neg Hx    Colon polyps Neg Hx    Esophageal cancer Neg Hx    Rectal cancer Neg Hx    Stomach cancer Neg Hx     Social History   Socioeconomic History   Marital status: Married    Spouse name: Not on file   Number of children: Not on file   Years of education: Not on file   Highest education level: Not on file  Occupational History   Not on file  Tobacco Use   Smoking status: Never   Smokeless tobacco: Never  Vaping Use   Vaping Use: Never used  Substance and Sexual Activity   Alcohol use: Yes    Comment: "moderate use"   Drug use: Never   Sexual activity: Yes    Partners: Male    Comment: married  Other Topics Concern   Not on file  Social History Narrative   Married, 2 sons and 1 daughter.   Lives in [place].   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   No T/A/Ds.   Social Determinants of Health   Financial Resource Strain: Not on file  Food Insecurity: Not on file  Transportation Needs: Not on file  Physical Activity: Not on file  Stress: Not on file  Social Connections: Not on file  Intimate Partner Violence: Not on file    Outpatient Medications Prior to Visit  Medication Sig Dispense Refill   Fluticasone Propionate (FLONASE NA) Place into the nose as needed.     Loratadine (CLARITIN PO) Take  by mouth as needed for allergies.     Multiple Vitamin (MULTIVITAMIN ADULT PO) Take 1 capsule by mouth daily.     promethazine (PHENERGAN) 12.5 MG suppository Unwrap and insert 1 suppository rectally every 6 hours as needed for nausea (Patient not taking: Reported on 12/31/2021) 10 suppository 0   promethazine (PHENERGAN) 12.5 MG tablet Take 1 tablet (12.5 mg total) by mouth every 6 (six) hours as needed for nausea or vomiting. (Patient not taking: Reported on 12/31/2021) 10 tablet 0   estradiol (ESTRACE) 0.1 MG/GM vaginal cream Insert 1 gram of cream in vagina every night for 14 nights then every Monday and Thursday (Patient not taking: Reported on 12/31/2021) 42.5 g 5   No facility-administered medications prior to visit.    No Known Allergies  ROS Review of Systems  Constitutional:  Negative for appetite change, chills, fatigue and fever.  HENT:  Positive for congestion (nasal), postnasal drip and rhinorrhea. Negative for dental problem, ear pain and sore throat.   Eyes:  Negative for discharge, redness and visual disturbance.  Respiratory:  Negative for cough, chest tightness, shortness of breath and wheezing.   Cardiovascular:  Negative for chest pain, palpitations and leg swelling.  Gastrointestinal:  Negative for abdominal pain, blood in stool, diarrhea, nausea and vomiting.  Genitourinary:  Negative for difficulty urinating, dysuria, flank pain, frequency, hematuria and urgency.  Musculoskeletal:  Negative for arthralgias, back pain, joint swelling, myalgias and neck stiffness.  Skin:  Negative for pallor and rash.  Neurological:  Negative for dizziness, speech difficulty, weakness and headaches.  Hematological:  Negative for adenopathy. Does not bruise/bleed easily.  Psychiatric/Behavioral:  Negative for confusion and sleep disturbance. The patient is not nervous/anxious.     PE;    12/31/2021    9:12 AM 05/31/2021    1:33 PM 01/05/2020    3:23 PM  Vitals with BMI  Height 5'  2.75" 5' 2.75"   Weight 118 lbs 117 lbs 6 oz   BMI 21.07 20.96   Systolic 96 107 135  Diastolic 66 70 78  Pulse 84 70 70   Exam chaperoned by Emi Holes, CMA.  Gen: Alert, well appearing.  Patient is oriented to person, place, time, and situation. AFFECT: pleasant, lucid thought and speech. ENT: Ears: EACs clear, normal epithelium.  TMs with good light reflex and landmarks bilaterally.  Eyes: no injection, icteris, swelling, or exudate.  EOMI, PERRLA. Nose: no drainage or turbinate edema/swelling.  No injection or focal lesion.  Mouth: lips without lesion/swelling.  Oral mucosa pink and moist.  Dentition intact and without obvious caries or gingival swelling.  Oropharynx without erythema, exudate, or swelling.  Neck: supple/nontender.  No LAD, mass, or TM.  Carotid pulses 2+ bilaterally, without bruits. CV: RRR, no m/r/g.   LUNGS: CTA bilat, nonlabored resps, good aeration in all lung fields. ABD: soft, NT, ND, BS normal.  No hepatospenomegaly or mass.  No bruits. EXT: no clubbing, cyanosis, or edema.  Musculoskeletal: no joint swelling, erythema, warmth, or tenderness.  ROM of all joints intact. Skin - no sores or suspicious lesions or rashes or color changes   Pertinent labs:  None  ASSESSMENT AND PLAN:   1) Health maintenance exam: Reviewed age and gender appropriate health maintenance issues (prudent diet, regular exercise, health risks of tobacco and excessive alcohol, use of seatbelts, fire alarms in home, use of sunscreen).  Also reviewed age and gender appropriate health screening as well as vaccine recommendations. Vaccines: Tdap->given today.  Flu->given today.  Shingrix->deferred. Labs: fasting [place] ordered Cervical ca screening: she is estab with Dr. Malachi Carl GYN, as of 05/2021. Breast ca screening: Mammogram -NEG11/07/2021.  Repeat 1 year. Colon ca screening: Normal colonoscopy 2021.  Recall 2031.  2) Allergic rhinitis. Bothering her more lately. No sign of  infection. Discussed possibly brief course of prednisone but she declined. She will try switching from claritin to allegra.  Continue flonase.  An After Visit Summary was printed and given to the patient.  FOLLOW UP:  Return in about 1 year (around 01/01/2023) for annual CPE (fasting).  Signed:  Santiago Bumpers, MD           12/31/2021

## 2021-12-31 NOTE — Addendum Note (Signed)
Addended by: Emi Holes D on: 12/31/2021 09:34 AM   Modules accepted: Orders

## 2022-01-06 ENCOUNTER — Encounter: Payer: Self-pay | Admitting: Family Medicine

## 2022-09-08 ENCOUNTER — Other Ambulatory Visit (HOSPITAL_BASED_OUTPATIENT_CLINIC_OR_DEPARTMENT_OTHER): Payer: Self-pay

## 2022-09-08 MED ORDER — TRIAMCINOLONE ACETONIDE 0.1 % EX OINT
1.0000 | TOPICAL_OINTMENT | Freq: Every day | CUTANEOUS | 0 refills | Status: DC | PRN
  Filled 2022-09-08: qty 60, 30d supply, fill #0

## 2022-09-08 MED ORDER — ESTRADIOL 0.1 MG/GM VA CREA
1.0000 g | TOPICAL_CREAM | Freq: Every day | VAGINAL | 5 refills | Status: AC
  Filled 2022-09-08: qty 42.5, 90d supply, fill #0
  Filled 2023-02-13: qty 42.5, 90d supply, fill #1
  Filled 2023-07-15: qty 42.5, 90d supply, fill #2

## 2023-01-02 ENCOUNTER — Ambulatory Visit: Payer: BC Managed Care – PPO | Admitting: Family Medicine

## 2023-01-02 ENCOUNTER — Encounter: Payer: Self-pay | Admitting: Family Medicine

## 2023-01-02 VITALS — BP 95/63 | HR 67 | Temp 98.7°F | Ht 63.0 in | Wt 116.2 lb

## 2023-01-02 DIAGNOSIS — Z23 Encounter for immunization: Secondary | ICD-10-CM

## 2023-01-02 DIAGNOSIS — Z0000 Encounter for general adult medical examination without abnormal findings: Secondary | ICD-10-CM | POA: Diagnosis not present

## 2023-01-02 DIAGNOSIS — J302 Other seasonal allergic rhinitis: Secondary | ICD-10-CM | POA: Diagnosis not present

## 2023-01-02 LAB — CBC
HCT: 40 % (ref 36.0–46.0)
Hemoglobin: 13.4 g/dL (ref 12.0–15.0)
MCHC: 33.4 g/dL (ref 30.0–36.0)
MCV: 96.3 fL (ref 78.0–100.0)
Platelets: 215 10*3/uL (ref 150.0–400.0)
RBC: 4.16 Mil/uL (ref 3.87–5.11)
RDW: 12.6 % (ref 11.5–15.5)
WBC: 4.6 10*3/uL (ref 4.0–10.5)

## 2023-01-02 LAB — COMPREHENSIVE METABOLIC PANEL
ALT: 17 U/L (ref 0–35)
AST: 20 U/L (ref 0–37)
Albumin: 3.9 g/dL (ref 3.5–5.2)
Alkaline Phosphatase: 91 U/L (ref 39–117)
BUN: 13 mg/dL (ref 6–23)
CO2: 30 meq/L (ref 19–32)
Calcium: 9 mg/dL (ref 8.4–10.5)
Chloride: 106 meq/L (ref 96–112)
Creatinine, Ser: 0.72 mg/dL (ref 0.40–1.20)
GFR: 91.48 mL/min (ref >60.00)
Glucose, Bld: 97 mg/dL (ref 70–99)
Potassium: 4.4 meq/L (ref 3.5–5.1)
Sodium: 142 meq/L (ref 135–145)
Total Bilirubin: 0.4 mg/dL (ref 0.2–1.2)
Total Protein: 6.3 g/dL (ref 6.0–8.3)

## 2023-01-02 LAB — LIPID PANEL
Cholesterol: 148 mg/dL (ref 0–200)
HDL: 42.3 mg/dL (ref >39.00)
LDL Cholesterol: 80 mg/dL (ref 0–99)
NonHDL: 105.6
Total CHOL/HDL Ratio: 3
Triglycerides: 130 mg/dL (ref 0.0–149.0)
VLDL: 26 mg/dL (ref 0.0–40.0)

## 2023-01-02 LAB — TSH: TSH: 1.63 u[IU]/mL (ref 0.35–5.50)

## 2023-01-02 NOTE — Progress Notes (Signed)
Office Note 01/02/2023  CC:  Chief Complaint  Patient presents with   Annual Exam    Pt is fasting    HPI:  Patient is a 57 y.o. female who is here for annual health maintenance exam and discuss allergic rhinitis.  Nasal congestion, postnasal drip, sneezing, right ear fullness--> all worse the last 4 to 6 weeks.  She has symptoms year-round but spring and fall are her worst times. No fever, no sore throat, no cough or wheeze. She takes Flonase and Claritin but not every day.  Past Medical History:  Diagnosis Date   Allergic rhinitis    Foot fracture, left 12/2019   Vertigo    2-3 episodes per year    Past Surgical History:  Procedure Laterality Date   COLONOSCOPY  08/21/2019   NORMAL 2021. Recall 10 yrs   REFRACTIVE SURGERY Bilateral 2010   TONSILLECTOMY  1972   WISDOM TOOTH EXTRACTION  1984    Family History  Problem Relation Age of Onset   Healthy Father    Breast cancer Maternal Grandmother    Colon cancer Neg Hx    Colon polyps Neg Hx    Esophageal cancer Neg Hx    Rectal cancer Neg Hx    Stomach cancer Neg Hx     Social History   Socioeconomic History   Marital status: Married    Spouse name: Not on file   Number of children: Not on file   Years of education: Not on file   Highest education level: Not on file  Occupational History   Not on file  Tobacco Use   Smoking status: Never   Smokeless tobacco: Never  Vaping Use   Vaping status: Never Used  Substance and Sexual Activity   Alcohol use: Yes    Comment: "moderate use"   Drug use: Never   Sexual activity: Yes    Partners: Male    Comment: married  Other Topics Concern   Not on file  Social History Narrative   Married, 2 sons and 1 daughter.   Lives in [place].   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   No T/A/Ds.   Social Determinants of Health   Financial Resource Strain: Not on file  Food Insecurity: Not on file  Transportation Needs: Not  on file  Physical Activity: Not on file  Stress: Not on file  Social Connections: Not on file  Intimate Partner Violence: Not on file    Outpatient Medications Prior to Visit  Medication Sig Dispense Refill   estradiol (ESTRACE) 0.1 MG/GM vaginal cream Use applicator to insert 1 gram of cream into vagina every night at bed time for 2 weeks, then every Monday and Thursday night thereafter 42.5 g 5   Fluticasone Propionate (FLONASE NA) Place into the nose as needed.     Loratadine (CLARITIN PO) Take by mouth as needed for allergies.     Multiple Vitamin (MULTIVITAMIN ADULT PO) Take 1 capsule by mouth daily.     promethazine (PHENERGAN) 12.5 MG suppository Unwrap and insert 1 suppository rectally every 6 hours as needed for nausea (Patient not taking: Reported on 12/31/2021) 10 suppository 0   promethazine (PHENERGAN) 12.5 MG tablet Take 1 tablet (12.5 mg total) by mouth every 6 (six) hours as needed for nausea or vomiting. (Patient not taking: Reported on 12/31/2021) 10 tablet 0   triamcinolone ointment (KENALOG) 0.1 % Apply a thin layer to the affected area three times daily  for 1 week, then twice daily for 2 weeks, then daily for 1 week, then daily on Mondays, Wednesdays, and Fridays for 1 week, then stop. (Patient not taking: Reported on 01/02/2023) 60 g 0   No facility-administered medications prior to visit.    No Known Allergies  Review of Systems  Constitutional:  Negative for appetite change, chills, fatigue and fever.  HENT:  Positive for congestion, postnasal drip, rhinorrhea and sneezing. Negative for dental problem, ear pain and sore throat.   Eyes:  Negative for discharge, redness and visual disturbance.  Respiratory:  Negative for cough, chest tightness, shortness of breath and wheezing.   Cardiovascular:  Negative for chest pain, palpitations and leg swelling.  Gastrointestinal:  Negative for abdominal pain, blood in stool, diarrhea, nausea and vomiting.  Genitourinary:   Negative for difficulty urinating, dysuria, flank pain, frequency, hematuria and urgency.  Musculoskeletal:  Negative for arthralgias, back pain, joint swelling, myalgias and neck stiffness.  Skin:  Negative for pallor and rash.  Neurological:  Negative for dizziness, speech difficulty, weakness and headaches.  Hematological:  Negative for adenopathy. Does not bruise/bleed easily.  Psychiatric/Behavioral:  Negative for confusion and sleep disturbance. The patient is not nervous/anxious.     PE;    01/02/2023    8:41 AM 12/31/2021    9:12 AM 05/31/2021    1:33 PM  Vitals with BMI  Height 5\' 3"  5' 2.75" 5' 2.75"  Weight 116 lbs 3 oz 118 lbs 117 lbs 6 oz  BMI 20.59 21.07 20.96  Systolic 95 96 107  Diastolic 63 66 70  Pulse 67 84 70   Exam chaperoned by Emi Holes, CMA.  Gen: Alert, well appearing.  Patient is oriented to person, place, time, and situation. AFFECT: pleasant, lucid thought and speech. ENT: Ears: EACs clear, normal epithelium.  TMs with good light reflex and landmarks bilaterally.  Eyes: no injection, icteris, swelling, or exudate.  EOMI, PERRLA. Nose: no drainage or turbinate edema/swelling.  No injection or focal lesion.  Mouth: lips without lesion/swelling.  Oral mucosa pink and moist.  Dentition intact and without obvious caries or gingival swelling.  Oropharynx without erythema, exudate, or swelling.  Neck: supple/nontender.  No LAD, mass, or TM.  Carotid pulses 2+ bilaterally, without bruits. CV: RRR, no m/r/g.   LUNGS: CTA bilat, nonlabored resps, good aeration in all lung fields. ABD: soft, NT, ND, BS normal.  No hepatospenomegaly or mass.  No bruits. EXT: no clubbing, cyanosis, or edema.  Musculoskeletal: no joint swelling, erythema, warmth, or tenderness.  ROM of all joints intact. Skin - no sores or suspicious lesions or rashes or color changes  Pertinent labs:  Lab Results  Component Value Date   TSH 1.10 12/31/2021   Lab Results  Component Value  Date   WBC 4.2 12/31/2021   HGB 13.3 12/31/2021   HCT 39.3 12/31/2021   MCV 94.1 12/31/2021   PLT 192.0 12/31/2021   Lab Results  Component Value Date   CREATININE 0.78 12/31/2021   BUN 12 12/31/2021   NA 141 12/31/2021   K 4.6 12/31/2021   CL 107 12/31/2021   CO2 31 12/31/2021   Lab Results  Component Value Date   ALT 25 12/31/2021   AST 25 12/31/2021   ALKPHOS 81 12/31/2021   BILITOT 0.4 12/31/2021   Lab Results  Component Value Date   CHOL 156 12/31/2021   Lab Results  Component Value Date   HDL 47.40 12/31/2021   Lab Results  Component Value Date  LDLCALC 96 12/31/2021   Lab Results  Component Value Date   TRIG 62.0 12/31/2021   Lab Results  Component Value Date   CHOLHDL 3 12/31/2021    ASSESSMENT AND PLAN:   #1 health maintenance exam: Reviewed age and gender appropriate health maintenance issues (prudent diet, regular exercise, health risks of tobacco and excessive alcohol, use of seatbelts, fire alarms in home, use of sunscreen).  Also reviewed age and gender appropriate health screening as well as vaccine recommendations. Vaccines: Tdap UTD.  Flu->given today.  Shingrix->Has had #1, is planning a good time for #2. Labs: fasting [facility] ordered Cervical ca screening: she is estab with Dr. Malachi Carl GYN, as of 05/2021. Breast ca screening: Mammogram -NEG11/07/2021.  Due for repeat. Colon ca screening: Normal colonoscopy 2021.  Recall 2031.  #2 chronic allergic rhinitis, significant seasonal component, not well-controlled. Discussed some options today: Take Flonase every day and rotate antihistamines every 3 months. The other option would be to do a 5 to 10-day course of prednisone. She wants to take the route of making sure she takes her Flonase every day and we will see how this goes.  An After Visit Summary was printed and given to the patient.  FOLLOW UP:  Return in about 1 year (around 01/02/2024) for annual CPE (fasting).  Signed:  Santiago Bumpers, MD           01/02/2023

## 2023-01-02 NOTE — Patient Instructions (Signed)
Don't forget to schedule your mammogram!   Health Maintenance, Female Adopting a healthy lifestyle and getting preventive care are important in promoting health and wellness. Ask your health care provider about: The right schedule for you to have regular tests and exams. Things you can do on your own to prevent diseases and keep yourself healthy. What should I know about diet, weight, and exercise? Eat a healthy diet  Eat a diet that includes plenty of vegetables, fruits, low-fat dairy products, and lean protein. Do not eat a lot of foods that are high in solid fats, added sugars, or sodium. Maintain a healthy weight Body mass index (BMI) is used to identify weight problems. It estimates body fat based on height and weight. Your health care provider can help determine your BMI and help you achieve or maintain a healthy weight. Get regular exercise Get regular exercise. This is one of the most important things you can do for your health. Most adults should: Exercise for at least 150 minutes each week. The exercise should increase your heart rate and make you sweat (moderate-intensity exercise). Do strengthening exercises at least twice a week. This is in addition to the moderate-intensity exercise. Spend less time sitting. Even light physical activity can be beneficial. Watch cholesterol and blood lipids Have your blood tested for lipids and cholesterol at 59 years of age, then have this test every 5 years. Have your cholesterol levels checked more often if: Your lipid or cholesterol levels are high. You are older than 59 years of age. You are at high risk for heart disease. What should I know about cancer screening? Depending on your health history and family history, you may need to have cancer screening at various ages. This may include screening for: Breast cancer. Cervical cancer. Colorectal cancer. Skin cancer. Lung cancer. What should I know about heart disease, diabetes, and  high blood pressure? Blood pressure and heart disease High blood pressure causes heart disease and increases the risk of stroke. This is more likely to develop in people who have high blood pressure readings or are overweight. Have your blood pressure checked: Every 3-5 years if you are 16-4 years of age. Every year if you are 14 years old or older. Diabetes Have regular diabetes screenings. This checks your fasting blood sugar level. Have the screening done: Once every three years after age 97 if you are at a normal weight and have a low risk for diabetes. More often and at a younger age if you are overweight or have a high risk for diabetes. What should I know about preventing infection? Hepatitis B If you have a higher risk for hepatitis B, you should be screened for this virus. Talk with your health care provider to find out if you are at risk for hepatitis B infection. Hepatitis C Testing is recommended for: Everyone born from [birth year] through 1967. Anyone with known risk factors for hepatitis C. Sexually transmitted infections (STIs) Get screened for STIs, including gonorrhea and chlamydia, if: You are sexually active and are younger than 59 years of age. You are older than 59 years of age and your health care provider tells you that you are at risk for this type of infection. Your sexual activity has changed since you were last screened, and you are at increased risk for chlamydia or gonorrhea. Ask your health care provider if you are at risk. Ask your health care provider about whether you are at high risk for HIV. Your health care provider may  recommend a prescription medicine to help prevent HIV infection. If you choose to take medicine to prevent HIV, you should first get tested for HIV. You should then be tested every 3 months for as long as you are taking the medicine. Pregnancy If you are about to stop having your period (premenopausal) and you may become pregnant, seek counseling  before you get pregnant. Take 400 to 800 micrograms (mcg) of folic acid every day if you become pregnant. Ask for birth control (contraception) if you want to prevent pregnancy. Osteoporosis and menopause Osteoporosis is a disease in which the bones lose minerals and strength with aging. This can result in bone fractures. If you are 48 years old or older, or if you are at risk for osteoporosis and fractures, ask your health care provider if you should: Be screened for bone loss. Take a calcium or vitamin D supplement to lower your risk of fractures. Be given hormone replacement therapy (HRT) to treat symptoms of menopause. Follow these instructions at home: Alcohol use Do not drink alcohol if: Your health care provider tells you not to drink. You are pregnant, may be pregnant, or are planning to become pregnant. If you drink alcohol: Limit how much you have to: 0-1 drink a day. Know how much alcohol is in your drink. In the [place] one drink equals one 12 oz bottle of beer (355 mL), one 5 oz glass of wine (148 mL), or one 1 oz glass of hard liquor (44 mL). Lifestyle Do not use any products that contain nicotine or tobacco. These products include cigarettes, chewing tobacco, and vaping devices, such as e-cigarettes. If you need help quitting, ask your health care provider. Do not use street drugs. Do not share needles. Ask your health care provider for help if you need support or information about quitting drugs. General instructions Schedule regular health, dental, and eye exams. Stay current with your vaccines. Tell your health care provider if: You often feel depressed. You have ever been abused or do not feel safe at home. Summary Adopting a healthy lifestyle and getting preventive care are important in promoting health and wellness. Follow your health care provider's instructions about healthy diet, exercising, and getting tested or screened for diseases. Follow your health care  provider's instructions on monitoring your cholesterol and blood pressure. This information is not intended to replace advice given to you by your health care provider. Make sure you discuss any questions you have with your health care provider. Document Revised: 06/29/2020 Document Reviewed: 06/29/2020 Elsevier Patient Education  2024 [employer].

## 2023-02-20 ENCOUNTER — Ambulatory Visit (HOSPITAL_BASED_OUTPATIENT_CLINIC_OR_DEPARTMENT_OTHER)
Admission: RE | Admit: 2023-02-20 | Discharge: 2023-02-20 | Disposition: A | Discharge status: Home / Self Care | Payer: BC Managed Care – PPO | Source: Ambulatory Visit | Attending: Family Medicine | Admitting: Family Medicine

## 2023-02-20 ENCOUNTER — Encounter (HOSPITAL_BASED_OUTPATIENT_CLINIC_OR_DEPARTMENT_OTHER): Payer: Self-pay | Admitting: Radiology

## 2023-02-20 DIAGNOSIS — Z1231 Encounter for screening mammogram for malignant neoplasm of breast: Secondary | ICD-10-CM | POA: Insufficient documentation

## 2023-04-06 ENCOUNTER — Other Ambulatory Visit (HOSPITAL_BASED_OUTPATIENT_CLINIC_OR_DEPARTMENT_OTHER): Payer: Self-pay

## 2023-04-06 ENCOUNTER — Ambulatory Visit: Payer: BC Managed Care – PPO | Admitting: Family Medicine

## 2023-04-06 ENCOUNTER — Encounter: Payer: Self-pay | Admitting: Family Medicine

## 2023-04-06 VITALS — BP 126/77 | HR 63 | Temp 98.6°F | Ht 63.0 in | Wt 115.4 lb

## 2023-04-06 DIAGNOSIS — J019 Acute sinusitis, unspecified: Secondary | ICD-10-CM

## 2023-04-06 DIAGNOSIS — J069 Acute upper respiratory infection, unspecified: Secondary | ICD-10-CM | POA: Diagnosis not present

## 2023-04-06 DIAGNOSIS — R509 Fever, unspecified: Secondary | ICD-10-CM

## 2023-04-06 DIAGNOSIS — R051 Acute cough: Secondary | ICD-10-CM

## 2023-04-06 MED ORDER — DOXYCYCLINE HYCLATE 100 MG PO CAPS
100.0000 mg | ORAL_CAPSULE | Freq: Two times a day (BID) | ORAL | 0 refills | Status: AC
  Filled 2023-04-06: qty 20, 10d supply, fill #0

## 2023-04-06 NOTE — Progress Notes (Signed)
OFFICE VISIT  04/06/2023  CC:  Chief Complaint  Patient presents with   Cough    Pt also has nasal congestion, runny nose, and low grade fever x 2 weeks. Has used combo med for cough, congestion but not helping much.    Patient is a 58 y.o. female who presents for cough.  HPI: For little over 2 weeks now she has had significant nasal congestion, runny nose, postnasal drip, some facial pressure, and cough. No fever or bodyaches or shortness of breath. She takes Flonase and Claritin chronically for allergies.   Past Medical History:  Diagnosis Date   Allergic rhinitis    Foot fracture, left 12/2019   Vertigo    2-3 episodes per year    Past Surgical History:  Procedure Laterality Date   COLONOSCOPY  08/21/2019   NORMAL 2021. Recall 10 yrs   REFRACTIVE SURGERY Bilateral 2010   TONSILLECTOMY  1972   WISDOM TOOTH EXTRACTION  1984    Outpatient Medications Prior to Visit  Medication Sig Dispense Refill   estradiol (ESTRACE) 0.1 MG/GM vaginal cream Use applicator to insert 1 gram of cream into vagina every night at bed time for 2 weeks, then every Monday and Thursday night thereafter 42.5 g 5   Fluticasone Propionate (FLONASE NA) Place into the nose as needed.     Loratadine (CLARITIN PO) Take by mouth as needed for allergies.     Multiple Vitamin (MULTIVITAMIN ADULT PO) Take 1 capsule by mouth daily.     No facility-administered medications prior to visit.    No Known Allergies  Review of Systems  As per HPI  PE:    04/06/2023    3:48 PM 01/02/2023    8:41 AM 12/31/2021    9:12 AM  Vitals with BMI  Height 5\' 3"  5\' 3"  5' 2.75"  Weight 115 lbs 7 oz 116 lbs 3 oz 118 lbs  BMI 20.45 20.59 21.07  Systolic 126 95 96  Diastolic 77 63 66  Pulse 63 67 84     Physical Exam  VS: noted--normal. Gen: alert, NAD, NONTOXIC APPEARING. HEENT: eyes without injection, drainage, or swelling.  Ears: EACs clear, TMs with normal light reflex and landmarks.  Nose: Clear  rhinorrhea, with some dried, crusty exudate adherent to mildly injected mucosa.  No purulent d/c.  No paranasal sinus TTP.  No facial swelling.  Throat and mouth without focal lesion.  No pharyngial swelling, erythema, or exudate.   Neck: supple, no LAD.   LUNGS: CTA bilat, nonlabored resps.   CV: RRR, no m/r/g. EXT: no c/c/e SKIN: no rash   LABS:  Last metabolic panel Lab Results  Component Value Date   GLUCOSE 97 01/02/2023   NA 142 01/02/2023   K 4.4 01/02/2023   CL 106 01/02/2023   CO2 30 01/02/2023   BUN 13 01/02/2023   CREATININE 0.72 01/02/2023   GFR 91.48 01/02/2023   CALCIUM 9.0 01/02/2023   PROT 6.3 01/02/2023   ALBUMIN 3.9 01/02/2023   BILITOT 0.4 01/02/2023   ALKPHOS 91 01/02/2023   AST 20 01/02/2023   ALT 17 01/02/2023   IMPRESSION AND PLAN:  Acute sinusitis. Doxycycline 100 mg twice daily x 10 days. Saline nasal spray recommended. Short course of Afrin nasal spray recommended. Therapeutic expectations and side effect profile of medication discussed today.  Patient's questions answered.  An After Visit Summary was printed and given to the patient.  FOLLOW UP: No follow-ups on file.  Signed:  Santiago Bumpers, MD  04/06/2023  

## 2023-04-06 NOTE — Patient Instructions (Signed)
Try over the counter afrin (generic is fine) as directed on the packaging. Do not use this for more than 3 consecutive days.  Use over the counter saline nasal spray several times a day to clear and moisturize your nasal passages.

## 2023-06-08 DIAGNOSIS — L578 Other skin changes due to chronic exposure to nonionizing radiation: Secondary | ICD-10-CM | POA: Diagnosis not present

## 2023-06-08 DIAGNOSIS — D1801 Hemangioma of skin and subcutaneous tissue: Secondary | ICD-10-CM | POA: Diagnosis not present

## 2023-06-08 DIAGNOSIS — C44319 Basal cell carcinoma of skin of other parts of face: Secondary | ICD-10-CM | POA: Diagnosis not present

## 2023-06-08 DIAGNOSIS — D485 Neoplasm of uncertain behavior of skin: Secondary | ICD-10-CM | POA: Diagnosis not present

## 2023-06-08 DIAGNOSIS — L570 Actinic keratosis: Secondary | ICD-10-CM | POA: Diagnosis not present

## 2023-07-16 ENCOUNTER — Other Ambulatory Visit: Payer: Self-pay

## 2023-07-28 ENCOUNTER — Other Ambulatory Visit (HOSPITAL_BASED_OUTPATIENT_CLINIC_OR_DEPARTMENT_OTHER): Payer: Self-pay

## 2023-08-02 DIAGNOSIS — C44319 Basal cell carcinoma of skin of other parts of face: Secondary | ICD-10-CM | POA: Diagnosis not present

## 2023-08-02 DIAGNOSIS — D1801 Hemangioma of skin and subcutaneous tissue: Secondary | ICD-10-CM | POA: Diagnosis not present

## 2023-08-02 DIAGNOSIS — L821 Other seborrheic keratosis: Secondary | ICD-10-CM | POA: Diagnosis not present

## 2023-08-02 DIAGNOSIS — L570 Actinic keratosis: Secondary | ICD-10-CM | POA: Diagnosis not present

## 2023-08-02 DIAGNOSIS — L578 Other skin changes due to chronic exposure to nonionizing radiation: Secondary | ICD-10-CM | POA: Diagnosis not present

## 2023-08-07 DIAGNOSIS — C44319 Basal cell carcinoma of skin of other parts of face: Secondary | ICD-10-CM | POA: Diagnosis not present

## 2024-01-26 ENCOUNTER — Ambulatory Visit: Admitting: Family Medicine

## 2024-01-26 ENCOUNTER — Encounter: Payer: Self-pay | Admitting: Family Medicine

## 2024-01-26 VITALS — BP 110/77 | HR 75 | Temp 97.3°F | Ht 62.5 in | Wt 117.8 lb

## 2024-01-26 DIAGNOSIS — Z0000 Encounter for general adult medical examination without abnormal findings: Secondary | ICD-10-CM

## 2024-01-26 DIAGNOSIS — J069 Acute upper respiratory infection, unspecified: Secondary | ICD-10-CM | POA: Diagnosis not present

## 2024-01-26 LAB — CBC WITH DIFFERENTIAL/PLATELET
Basophils Absolute: 0.1 K/uL (ref 0.0–0.1)
Basophils Relative: 1.5 % (ref 0.0–3.0)
Eosinophils Absolute: 0.4 K/uL (ref 0.0–0.7)
Eosinophils Relative: 8 % — ABNORMAL HIGH (ref 0.0–5.0)
HCT: 39.8 % (ref 36.0–46.0)
Hemoglobin: 13.7 g/dL (ref 12.0–15.0)
Lymphocytes Relative: 17.1 % (ref 12.0–46.0)
Lymphs Abs: 0.9 K/uL (ref 0.7–4.0)
MCHC: 34.4 g/dL (ref 30.0–36.0)
MCV: 93.9 fl (ref 78.0–100.0)
Monocytes Absolute: 0.5 K/uL (ref 0.1–1.0)
Monocytes Relative: 9.2 % (ref 3.0–12.0)
Neutro Abs: 3.4 K/uL (ref 1.4–7.7)
Neutrophils Relative %: 64.2 % (ref 43.0–77.0)
Platelets: 177 K/uL (ref 150.0–400.0)
RBC: 4.24 Mil/uL (ref 3.87–5.11)
RDW: 12.3 % (ref 11.5–15.5)
WBC: 5.3 K/uL (ref 4.0–10.5)

## 2024-01-26 LAB — COMPREHENSIVE METABOLIC PANEL WITH GFR
ALT: 26 U/L (ref 0–35)
AST: 27 U/L (ref 0–37)
Albumin: 4.3 g/dL (ref 3.5–5.2)
Alkaline Phosphatase: 95 U/L (ref 39–117)
BUN: 12 mg/dL (ref 6–23)
CO2: 32 meq/L (ref 19–32)
Calcium: 9.3 mg/dL (ref 8.4–10.5)
Chloride: 105 meq/L (ref 96–112)
Creatinine, Ser: 0.8 mg/dL (ref 0.40–1.20)
GFR: 80.01 mL/min (ref >60.00)
Glucose, Bld: 84 mg/dL (ref 70–99)
Potassium: 4.4 meq/L (ref 3.5–5.1)
Sodium: 142 meq/L (ref 135–145)
Total Bilirubin: 0.6 mg/dL (ref 0.2–1.2)
Total Protein: 6.2 g/dL (ref 6.0–8.3)

## 2024-01-26 LAB — LIPID PANEL
Cholesterol: 159 mg/dL (ref 0–200)
HDL: 52.3 mg/dL (ref >39.00)
LDL Cholesterol: 97 mg/dL (ref 0–99)
NonHDL: 106.24
Total CHOL/HDL Ratio: 3
Triglycerides: 48 mg/dL (ref 0.0–149.0)
VLDL: 9.6 mg/dL (ref 0.0–40.0)

## 2024-01-26 LAB — TSH: TSH: 1.19 u[IU]/mL (ref 0.35–5.50)

## 2024-01-26 NOTE — Patient Instructions (Signed)

## 2024-01-26 NOTE — Progress Notes (Signed)
Office Note 01/26/2024  CC:  Chief Complaint  Patient presents with   Annual Exam    Pt is fasting   HPI:  Patient is a 59 y.o. female who is here for annual health maintenance exam.  Yesterday she started getting nasal congestion/runny nose, some postnasal drip and slight cough.  She feels rundown.  Mild subjective fever today.  No sore throat or headache.  No shortness of breath, wheezing, or rash.  She tried over-the-counter cold medicine and this did help temporarily. 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  No nausea or vomiting or diarrhea.   Otherwise she has been doing well.  Past Medical History:  Diagnosis Date   Allergic rhinitis    Foot fracture, left 12/2019   Vertigo    2-3 episodes per year    Past Surgical History:  Procedure Laterality Date   COLONOSCOPY  08/21/2019   NORMAL 2021. Recall 10 yrs   REFRACTIVE SURGERY Bilateral 2010   TONSILLECTOMY  1972   WISDOM TOOTH EXTRACTION  1984    Family History  Problem Relation Age of Onset   Healthy Father    Breast cancer Maternal Grandmother    Colon cancer Neg Hx    Colon polyps Neg Hx    Esophageal cancer Neg Hx    Rectal cancer Neg Hx    Stomach cancer Neg Hx     Social History   Socioeconomic History   Marital status: Married    Spouse name: Not on file   Number of children: Not on file   Years of education: Not on file   Highest education level: Not on file  Occupational History   Not on file  Tobacco Use   Smoking status: Never   Smokeless tobacco: Never  Vaping Use   Vaping status: Never Used  Substance and Sexual Activity   Alcohol use: Yes    Comment: moderate use   Drug use: Never   Sexual activity: Yes    Partners: Male    Comment: married  Other Topics Concern   Not on file  Social History Narrative   Married, 2 sons and 1 daughter.   Lives in [place].   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   No T/A/Ds.    Social Drivers of Corporate Investment Banker Strain: Not on file  Food Insecurity: Not on file  Transportation Needs: Not on file  Physical Activity: Not on file  Stress: Not on file  Social Connections: Not on file  Intimate Partner Violence: Not on file    Outpatient Medications Prior to Visit  Medication Sig Dispense Refill   estradiol  (ESTRACE ) 0.1 MG/GM vaginal cream Use applicator to insert 1 gram of cream into vagina every night at bed time for 2 weeks, then every Monday and Thursday night thereafter 42.5 g 5   Fluticasone Propionate (FLONASE NA) Place into the nose as needed.     Loratadine (CLARITIN PO) Take by mouth as needed for allergies.     Multiple Vitamin (MULTIVITAMIN ADULT PO) Take 1 capsule by mouth daily.     No facility-administered medications prior to visit.    No Known Allergies  Review of Systems  Constitutional:  Negative for appetite change, chills, fatigue and fever.  HENT:  Negative for congestion, dental problem, ear pain and sore throat.   Eyes:  Negative for discharge, redness and visual disturbance.  Respiratory:  Negative for cough,  chest tightness, shortness of breath and wheezing.   Cardiovascular:  Negative for chest pain, palpitations and leg swelling.  Gastrointestinal:  Negative for abdominal pain, blood in stool, diarrhea, nausea and vomiting.  Genitourinary:  Negative for difficulty urinating, dysuria, flank pain, frequency, hematuria and urgency.  Musculoskeletal:  Negative for arthralgias, back pain, joint swelling, myalgias and neck stiffness.  Skin:  Negative for pallor and rash.  Neurological:  Negative for dizziness, speech difficulty, weakness and headaches.  Hematological:  Negative for adenopathy. Does not bruise/bleed easily.  Psychiatric/Behavioral:  Negative for confusion and sleep disturbance. The patient is not nervous/anxious.     PE;    01/26/2024    9:08 AM 04/06/2023    3:48 PM 01/02/2023    8:41 AM  Vitals  with BMI  Height 5' 2.5 5' 3 5' 3  Weight 117 lbs 13 oz 115 lbs 7 oz 116 lbs 3 oz  BMI 21.19 20.45 20.59  Systolic 110 126 95  Diastolic 77 77 63  Pulse 75 63 67   VS: noted--normal. Gen: alert, NAD, NONTOXIC APPEARING. HEENT: eyes without injection, drainage, or swelling.  Ears: EACs clear, TMs with normal light reflex and landmarks.  Nose: Clear rhinorrhea, with some dried, crusty exudate adherent to mildly injected mucosa.  No purulent d/c.  No paranasal sinus TTP.  No facial swelling.  Throat and mouth without focal lesion.  No pharyngial swelling, erythema, or exudate.   Neck: supple, no LAD.   LUNGS: CTA bilat, nonlabored resps.   CV: RRR, no m/r/g. Abdomen: Soft, nontender nondistended EXT: no c/c/e SKIN: no rash  Pertinent labs:  Lab Results  Component Value Date   TSH 1.63 01/02/2023   Lab Results  Component Value Date   WBC 4.6 01/02/2023   HGB 13.4 01/02/2023   HCT 40.0 01/02/2023   MCV 96.3 01/02/2023   PLT 215.0 01/02/2023   Lab Results  Component Value Date   CREATININE 0.72 01/02/2023   BUN 13 01/02/2023   NA 142 01/02/2023   K 4.4 01/02/2023   CL 106 01/02/2023   CO2 30 01/02/2023   Lab Results  Component Value Date   ALT 17 01/02/2023   AST 20 01/02/2023   ALKPHOS 91 01/02/2023   BILITOT 0.4 01/02/2023   Lab Results  Component Value Date   CHOL 148 01/02/2023   Lab Results  Component Value Date   HDL 42.30 01/02/2023   Lab Results  Component Value Date   LDLCALC 80 01/02/2023   Lab Results  Component Value Date   TRIG 130.0 01/02/2023   Lab Results  Component Value Date   CHOLHDL 3 01/02/2023   ASSESSMENT AND PLAN:   #1 health maintenance exam: Reviewed age and gender appropriate health maintenance issues (prudent diet, regular exercise, health risks of tobacco and excessive alcohol, use of seatbelts, fire alarms in home, use of sunscreen).  Also reviewed age and gender appropriate health screening as well as vaccine  recommendations. Vaccines: Prevnar 20-->deferred for now. otherwise all up-to-date. Labs: fasting [facility] ordered Cervical ca screening: she is estab with Dr. Kate Laughter GYN, as of 05/2021. Has appt scheduled. Breast ca screening: Mammogram scheduled for 02/13/2024.  Colon ca screening: Normal colonoscopy 2021.  Recall 2031.  #2 viral URI. Symptomatic care discussed.  An After Visit Summary was printed and given to the patient.  FOLLOW UP:  Return in about 1 year (around 01/25/2025) for annual CPE (fasting).  Signed:  Phil Laurieanne Galloway, MD  01/26/2024  

## 2024-01-28 ENCOUNTER — Ambulatory Visit: Payer: Self-pay | Admitting: Family Medicine

## 2024-02-06 DIAGNOSIS — L821 Other seborrheic keratosis: Secondary | ICD-10-CM | POA: Diagnosis not present

## 2024-02-06 DIAGNOSIS — L814 Other melanin hyperpigmentation: Secondary | ICD-10-CM | POA: Diagnosis not present

## 2024-02-06 DIAGNOSIS — D485 Neoplasm of uncertain behavior of skin: Secondary | ICD-10-CM | POA: Diagnosis not present

## 2024-02-06 DIAGNOSIS — D1801 Hemangioma of skin and subcutaneous tissue: Secondary | ICD-10-CM | POA: Diagnosis not present

## 2024-02-06 DIAGNOSIS — L578 Other skin changes due to chronic exposure to nonionizing radiation: Secondary | ICD-10-CM | POA: Diagnosis not present

## 2024-02-13 ENCOUNTER — Inpatient Hospital Stay (HOSPITAL_BASED_OUTPATIENT_CLINIC_OR_DEPARTMENT_OTHER): Admission: RE | Admit: 2024-02-13 | Admitting: Radiology

## 2024-02-13 ENCOUNTER — Ambulatory Visit (HOSPITAL_BASED_OUTPATIENT_CLINIC_OR_DEPARTMENT_OTHER)
Admission: RE | Admit: 2024-02-13 | Discharge: 2024-02-13 | Disposition: A | Discharge status: Home / Self Care | Source: Ambulatory Visit | Attending: Family Medicine | Admitting: Family Medicine

## 2024-02-13 ENCOUNTER — Encounter (HOSPITAL_BASED_OUTPATIENT_CLINIC_OR_DEPARTMENT_OTHER): Payer: Self-pay | Admitting: Radiology

## 2024-02-13 ENCOUNTER — Other Ambulatory Visit (HOSPITAL_BASED_OUTPATIENT_CLINIC_OR_DEPARTMENT_OTHER): Payer: Self-pay | Admitting: Family Medicine

## 2024-02-13 DIAGNOSIS — Z124 Encounter for screening for malignant neoplasm of cervix: Secondary | ICD-10-CM | POA: Diagnosis not present

## 2024-02-13 DIAGNOSIS — Z1151 Encounter for screening for human papillomavirus (HPV): Secondary | ICD-10-CM | POA: Diagnosis not present

## 2024-02-13 DIAGNOSIS — Z1231 Encounter for screening mammogram for malignant neoplasm of breast: Secondary | ICD-10-CM

## 2024-02-13 DIAGNOSIS — Z01419 Encounter for gynecological examination (general) (routine) without abnormal findings: Secondary | ICD-10-CM | POA: Diagnosis not present
